# Patient Record
Sex: Female | Born: 1980
Health system: Southern US, Community
[De-identification: ages and names within clinical notes are randomized; demographics above are authoritative.]

## PROBLEM LIST (undated history)

## (undated) DIAGNOSIS — M5430 Sciatica, unspecified side: Secondary | ICD-10-CM

## (undated) DIAGNOSIS — H1859 Other hereditary corneal dystrophies: Secondary | ICD-10-CM

## (undated) DIAGNOSIS — E119 Type 2 diabetes mellitus without complications: Secondary | ICD-10-CM

## (undated) DIAGNOSIS — M1990 Unspecified osteoarthritis, unspecified site: Secondary | ICD-10-CM

## (undated) DIAGNOSIS — H18599 Other hereditary corneal dystrophies, unspecified eye: Secondary | ICD-10-CM

## (undated) DIAGNOSIS — H539 Unspecified visual disturbance: Secondary | ICD-10-CM

## (undated) HISTORY — DX: Other hereditary corneal dystrophies, unspecified eye: H18.599

## (undated) HISTORY — DX: Type 2 diabetes mellitus without complications: E11.9

## (undated) HISTORY — DX: Other hereditary corneal dystrophies: H18.59

## (undated) HISTORY — DX: Unspecified visual disturbance: H53.9

## (undated) HISTORY — DX: Unspecified osteoarthritis, unspecified site: M19.90

## (undated) HISTORY — PX: CHOLECYSTECTOMY: SHX55

## (undated) HISTORY — PX: EYE SURGERY: SHX253

---

## 2009-01-13 ENCOUNTER — Inpatient Hospital Stay (HOSPITAL_COMMUNITY): Admission: AD | Admit: 2009-01-13 | Discharge: 2009-01-13 | Payer: Self-pay | Admitting: Obstetrics & Gynecology

## 2009-01-13 ENCOUNTER — Ambulatory Visit: Payer: Self-pay | Admitting: Advanced Practice Midwife

## 2009-02-26 ENCOUNTER — Encounter: Admission: RE | Admit: 2009-02-26 | Discharge: 2009-02-26 | Payer: Self-pay | Admitting: Obstetrics and Gynecology

## 2009-04-16 ENCOUNTER — Ambulatory Visit: Payer: Self-pay | Admitting: Obstetrics & Gynecology

## 2009-04-19 ENCOUNTER — Ambulatory Visit: Payer: Self-pay | Admitting: Obstetrics & Gynecology

## 2009-05-07 ENCOUNTER — Inpatient Hospital Stay (HOSPITAL_COMMUNITY): Admission: RE | Admit: 2009-05-07 | Discharge: 2009-05-09 | Payer: Self-pay | Admitting: Obstetrics & Gynecology

## 2009-08-16 ENCOUNTER — Emergency Department (HOSPITAL_COMMUNITY): Admission: EM | Admit: 2009-08-16 | Discharge: 2009-08-16 | Payer: Self-pay | Admitting: Emergency Medicine

## 2009-10-21 ENCOUNTER — Emergency Department (HOSPITAL_COMMUNITY): Admission: EM | Admit: 2009-10-21 | Discharge: 2009-10-22 | Payer: Self-pay | Admitting: Emergency Medicine

## 2010-05-22 ENCOUNTER — Emergency Department (HOSPITAL_COMMUNITY): Admission: EM | Admit: 2010-05-22 | Discharge: 2010-05-22 | Payer: Self-pay | Admitting: Emergency Medicine

## 2010-10-08 LAB — DIFFERENTIAL
Basophils Absolute: 0 10*3/uL (ref 0.0–0.1)
Basophils Relative: 0 % (ref 0–1)
Eosinophils Absolute: 0.2 10*3/uL (ref 0.0–0.7)
Monocytes Absolute: 0.4 10*3/uL (ref 0.1–1.0)
Monocytes Relative: 8 % (ref 3–12)
Neutro Abs: 2.2 10*3/uL (ref 1.7–7.7)
Neutrophils Relative %: 49 % (ref 43–77)

## 2010-10-08 LAB — BASIC METABOLIC PANEL
BUN: 9 mg/dL (ref 6–23)
CO2: 28 mEq/L (ref 19–32)
Calcium: 9.7 mg/dL (ref 8.4–10.5)
GFR calc non Af Amer: >60 mL/min (ref >60)
Glucose, Bld: 110 mg/dL — ABNORMAL HIGH (ref 70–99)

## 2010-10-08 LAB — URINALYSIS, ROUTINE W REFLEX MICROSCOPIC
Glucose, UA: NEGATIVE mg/dL
Nitrite: NEGATIVE
Specific Gravity, Urine: 1.016 (ref 1.005–1.030)
pH: 6 (ref 5.0–8.0)

## 2010-10-08 LAB — CBC
HCT: 41.3 % (ref 36.0–46.0)
MCH: 31.4 pg (ref 26.0–34.0)
MCHC: 33.2 g/dL (ref 30.0–36.0)
RDW: 13 % (ref 11.5–15.5)

## 2010-10-08 LAB — URINE MICROSCOPIC-ADD ON

## 2010-10-12 LAB — URINALYSIS, ROUTINE W REFLEX MICROSCOPIC
Bilirubin Urine: NEGATIVE
Nitrite: NEGATIVE
Protein, ur: NEGATIVE mg/dL
Specific Gravity, Urine: 1.025 (ref 1.005–1.030)
Urobilinogen, UA: 1 mg/dL (ref 0.0–1.0)

## 2010-10-12 LAB — URINE MICROSCOPIC-ADD ON

## 2010-10-30 LAB — DIFFERENTIAL
Basophils Absolute: 0.1 10*3/uL (ref 0.0–0.1)
Basophils Relative: 1 % (ref 0–1)
Eosinophils Relative: 2 % (ref 0–5)
Lymphocytes Relative: 21 % (ref 12–46)
Monocytes Absolute: 0.4 10*3/uL (ref 0.1–1.0)
Neutro Abs: 5 10*3/uL (ref 1.7–7.7)

## 2010-10-30 LAB — CBC
HCT: 26.7 % — ABNORMAL LOW (ref 36.0–46.0)
HCT: 38.6 % (ref 36.0–46.0)
HCT: 39.9 % (ref 36.0–46.0)
Hemoglobin: 13.1 g/dL (ref 12.0–15.0)
Hemoglobin: 13.3 g/dL (ref 12.0–15.0)
Hemoglobin: 9.1 g/dL — ABNORMAL LOW (ref 12.0–15.0)
Hemoglobin: 9.2 g/dL — ABNORMAL LOW (ref 12.0–15.0)
MCHC: 33.3 g/dL (ref 30.0–36.0)
MCHC: 33.8 g/dL (ref 30.0–36.0)
MCV: 95.8 fL (ref 78.0–100.0)
MCV: 96.2 fL (ref 78.0–100.0)
Platelets: 64 10*3/uL — ABNORMAL LOW (ref 150–400)
Platelets: 97 10*3/uL — ABNORMAL LOW (ref 150–400)
RBC: 4.07 MIL/uL (ref 3.87–5.11)
RBC: 4.15 MIL/uL (ref 3.87–5.11)
RDW: 14.5 % (ref 11.5–15.5)
RDW: 14.5 % (ref 11.5–15.5)
RDW: 14.6 % (ref 11.5–15.5)
WBC: 6.4 10*3/uL (ref 4.0–10.5)
WBC: 6.7 10*3/uL (ref 4.0–10.5)

## 2010-10-30 LAB — GLUCOSE, CAPILLARY
Glucose-Capillary: 112 mg/dL — ABNORMAL HIGH (ref 70–99)
Glucose-Capillary: 153 mg/dL — ABNORMAL HIGH (ref 70–99)

## 2012-05-17 ENCOUNTER — Emergency Department (HOSPITAL_COMMUNITY)
Admission: EM | Admit: 2012-05-17 | Discharge: 2012-05-17 | Disposition: A | Discharge status: Home / Self Care | Payer: BC Managed Care – PPO | Attending: Emergency Medicine | Admitting: Emergency Medicine

## 2012-05-17 ENCOUNTER — Encounter (HOSPITAL_COMMUNITY): Payer: Self-pay | Admitting: *Deleted

## 2012-05-17 DIAGNOSIS — M5430 Sciatica, unspecified side: Secondary | ICD-10-CM | POA: Insufficient documentation

## 2012-05-17 HISTORY — DX: Sciatica, unspecified side: M54.30

## 2012-05-17 MED ORDER — OXYCODONE-ACETAMINOPHEN 5-325 MG PO TABS: 2.0000 | ORAL_TABLET | ORAL | Status: DC | PRN

## 2012-05-17 MED ORDER — DIAZEPAM 5 MG PO TABS: 5.0000 mg | ORAL_TABLET | Freq: Four times a day (QID) | ORAL | Status: DC | PRN

## 2012-05-17 MED ORDER — IBUPROFEN 600 MG PO TABS: 600.0000 mg | ORAL_TABLET | Freq: Four times a day (QID) | ORAL | Status: DC | PRN

## 2012-05-17 NOTE — ED Provider Notes (Signed)
History  Scribed for [employer]. Rubin Payor, MD, the patient was seen in room TR06C/TR06C. This chart was scribed by Candelaria Stagers. The patient's care started at 6:52 PM   CSN: [id number]  Arrival date & time 05/17/12  1703   First MD Initiated Contact with Patient 05/17/12 1850      Chief Complaint  Patient presents with  . back spasms   . Abdominal Injury    The history is provided by the patient. No language interpreter was used.   Jamie Lee is a 31 y.o. female who presents to the Emergency Department complaining of an exacerbation of muscle spasms and sciatica pain bilaterally that have become worse today.  She denies any recent injury or activity.  Pt states that she has not had an exacerbation in several months.  She reports that the pain radiates down to her knee and bearing weight on both legs makes the pain worse.  She denies any numbness, weakness, or loss of bowel or bladder control.     Past Medical History  Diagnosis Date  . Sciatica     Past Surgical History  Procedure Date  . Cholecystectomy     No family history on file.  History  Substance Use Topics  . Smoking status: Never Smoker   . Smokeless tobacco: Not on file  . Alcohol Use: Yes     occ    OB History    Grav Para Term Preterm Abortions TAB SAB Ect Mult Living                  Review of Systems  Constitutional: Negative for fever.  Genitourinary: Negative for difficulty urinating.  Musculoskeletal: Positive for back pain.       Muscle spasms to lower back bilaterally.   Neurological: Negative for weakness and numbness.  All other systems reviewed and are negative.    Allergies  Review of patient's allergies indicates no known allergies.  Home Medications   Current Outpatient Rx  Name Route Sig Dispense Refill  . IBUPROFEN 200 MG PO TABS Oral Take 800 mg by mouth every 6 (six) hours as needed. For pain.    Marland Kitchen DIAZEPAM 5 MG PO TABS Oral Take 1 tablet (5 mg total) by mouth every 6  (six) hours as needed (spasm). 10 tablet 0  . IBUPROFEN 600 MG PO TABS Oral Take 1 tablet (600 mg total) by mouth every 6 (six) hours as needed for pain. 20 tablet 0  . OXYCODONE-ACETAMINOPHEN 5-325 MG PO TABS Oral Take 2 tablets by mouth every 4 (four) hours as needed for pain. 15 tablet 0    BP 113/78  Pulse 81  Temp 98.4 F (36.9 C) (Oral)  Resp 18  SpO2 100%  Physical Exam  Nursing note and vitals reviewed. Constitutional: She is oriented to person, place, and time. She appears well-developed and well-nourished. No distress.  HENT:  Head: Normocephalic and atraumatic.  Eyes: EOM are normal. Pupils are equal, round, and reactive to light.  Neck: Neck supple. No tracheal deviation present.  Pulmonary/Chest: Effort normal. No respiratory distress.  Abdominal: There is no tenderness.  Musculoskeletal: Normal range of motion. She exhibits no edema.       Mild SI joint area tenderness bilaterally.  Strength and sensation intact distally.    Neurological: She is alert and oriented to person, place, and time. No sensory deficit.  Skin: Skin is warm and dry.  Psychiatric: She has a normal mood and affect. Her behavior is normal.  ED Course  Procedures   DIAGNOSTIC STUDIES:  COORDINATION OF CARE:    Labs Reviewed - No data to display No results found.   1. Sciatica       MDM  Patient with back pain. History of same. History of sciatica. No new injury. Patient is driving home. She's given prescriptions for pain medicines. She has no warning signs for severe surgical disease.  I personally performed the services described in this documentation, which was scribed in my presence. The recorded information has been reviewed and considered.         Juliet Rude. Rubin Payor, MD 05/17/12 2000

## 2012-05-17 NOTE — ED Notes (Signed)
PT is here with bilateral sciatica pain that goes down both sides and spasms have been bad today

## 2012-05-30 ENCOUNTER — Encounter (HOSPITAL_COMMUNITY): Payer: Self-pay

## 2012-05-30 ENCOUNTER — Emergency Department (HOSPITAL_COMMUNITY)
Admission: EM | Admit: 2012-05-30 | Discharge: 2012-05-30 | Disposition: A | Discharge status: Home / Self Care | Payer: BC Managed Care – PPO | Source: Home / Self Care | Attending: Emergency Medicine | Admitting: Emergency Medicine

## 2012-05-30 DIAGNOSIS — M549 Dorsalgia, unspecified: Secondary | ICD-10-CM

## 2012-05-30 DIAGNOSIS — G8929 Other chronic pain: Secondary | ICD-10-CM

## 2012-05-30 MED ORDER — KETOROLAC TROMETHAMINE 10 MG PO TABS: 10.0000 mg | ORAL_TABLET | Freq: Four times a day (QID) | ORAL | Status: DC | PRN

## 2012-05-30 NOTE — ED Provider Notes (Signed)
Degrees he is aAnd is andHistory     CSN: [id number]  Arrival date & time 05/30/12  1525   First MD Initiated Contact with Patient 05/30/12 1549      Chief Complaint  Patient presents with  . Back Pain    (Consider location/radiation/quality/duration/timing/severity/associated sxs/prior treatment) HPI Comments: Patient presents urgent care this evening complaining of ongoing lower back pain that radiates down to both of her legs. She was last seen in the emergency department in the last week of October. She describes the pain at times feels better she can go about a week without any pain. The last 3-4 days she's been expressing pain again describes it is very difficult for her to take the previously prescribed Percocet and diazepam but was prescribed to her primary spell medicine makes her feel drowsy asleep throughout the day. She wants to know if she can take anything else on the day so she will be as drowsy and sleepy. Patient denies any constitutional symptoms such as fevers, generalized malaise unintended weight loss myalgias arthralgias. Denies any numbness or tingling or weakness of her lower extremities. Describes the pain as shooting and sharp in character. Exacerbated with movement and leaning forward.  The history is provided by the patient.    Past Medical History  Diagnosis Date  . Sciatica     Past Surgical History  Procedure Date  . Cholecystectomy     No family history on file.  History  Substance Use Topics  . Smoking status: Never Smoker   . Smokeless tobacco: Not on file  . Alcohol Use: Yes     Comment: occ    OB History    Grav Para Term Preterm Abortions TAB SAB Ect Mult Living                  Review of Systems  Constitutional: Positive for activity change. Negative for fever, chills, diaphoresis, appetite change and fatigue.  Genitourinary: Negative for dysuria, flank pain and difficulty urinating.  Musculoskeletal: Positive for back pain.  Negative for joint swelling and gait problem.    Allergies  Review of patient's allergies indicates no known allergies.  Home Medications   Current Outpatient Rx  Name  Route  Sig  Dispense  Refill  . DIAZEPAM 5 MG PO TABS   Oral   Take 1 tablet (5 mg total) by mouth every 6 (six) hours as needed (spasm).   10 tablet   0   . OXYCODONE-ACETAMINOPHEN 5-325 MG PO TABS   Oral   Take 2 tablets by mouth every 4 (four) hours as needed for pain.   15 tablet   0   . KETOROLAC TROMETHAMINE 10 MG PO TABS   Oral   Take 1 tablet (10 mg total) by mouth every 6 (six) hours as needed for pain.   20 tablet   0     BP 120/89  Pulse 72  Temp 98.1 F (36.7 C) (Oral)  Resp 18  SpO2 100%  Physical Exam  Nursing note and vitals reviewed. Constitutional: Vital signs are normal. She appears well-developed and well-nourished.  Non-toxic appearance. She does not have a sickly appearance. She does not appear ill. No distress.  Eyes: Conjunctivae normal are normal.  Neck: Neck supple.  Pulmonary/Chest: Effort normal. No respiratory distress. She has no wheezes. She has no rales. She exhibits no tenderness.  Abdominal: Soft. She exhibits no distension.  Musculoskeletal: She exhibits tenderness.       Lumbar back: She  exhibits decreased range of motion, tenderness and pain. She exhibits no bony tenderness, no swelling, no edema, no deformity, no spasm and normal pulse.       Back:  Neurological: She is alert.  Skin: Skin is warm. No rash noted. No erythema.    ED Course  Procedures (including critical care time)  Labs Reviewed - No data to display No results found.   1. Chronic back pain       MDM   Exacerbation of chronic back pain. On exam patient demonstrated conserve and normal distal muscular strength of both of her quadriceps. Patient is able to dorsiflex and plantarflex her great toe. No distal vascular deficits. Patient in has been strongly recommended to followup in  orthopedic provider we had a long discussion about the long-term treatment of his pain with narcotics and benzodiazepines. Patient agrees that she will followup with [place] [facility] and we have prescribed her a prescription of Toradol to be use every 8-12 hours as needed. Patient agrees with treatment plan and understands that there might be secondary reasons for ongoing back pain have recommended her that she needs to have a full workup for her back. At this point her exam did not reveal any neurological emergencies such as a kaluretic canis syndrome or suggestion of spinal stenosis.       Jimmie Molly, MD 05/30/12 [phone]

## 2012-05-30 NOTE — ED Notes (Signed)
Patient complains of lower back pain that radiates down both legs, RX given in ER on 10/22 does not help with pain states it does not work and her knees are hurting too

## 2016-04-17 ENCOUNTER — Encounter (HOSPITAL_COMMUNITY): Payer: Self-pay | Admitting: *Deleted

## 2016-04-17 ENCOUNTER — Emergency Department (HOSPITAL_COMMUNITY)
Admission: EM | Admit: 2016-04-17 | Discharge: 2016-04-17 | Disposition: A | Discharge status: Home / Self Care | Payer: Self-pay | Attending: Emergency Medicine | Admitting: Emergency Medicine

## 2016-04-17 DIAGNOSIS — K219 Gastro-esophageal reflux disease without esophagitis: Secondary | ICD-10-CM | POA: Insufficient documentation

## 2016-04-17 DIAGNOSIS — R197 Diarrhea, unspecified: Secondary | ICD-10-CM | POA: Insufficient documentation

## 2016-04-17 LAB — CBC
HEMATOCRIT: 41.8 % (ref 36.0–46.0)
Hemoglobin: 13.4 g/dL (ref 12.0–15.0)
MCH: 30.5 pg (ref 26.0–34.0)
MCHC: 32.1 g/dL (ref 30.0–36.0)
MCV: 95 fL (ref 78.0–100.0)
Platelets: 205 10*3/uL (ref 150–400)
RBC: 4.4 MIL/uL (ref 3.87–5.11)
RDW: 13.5 % (ref 11.5–15.5)
WBC: 6.3 10*3/uL (ref 4.0–10.5)

## 2016-04-17 LAB — COMPREHENSIVE METABOLIC PANEL
ALBUMIN: 3.8 g/dL (ref 3.5–5.0)
ALT: 21 U/L (ref 14–54)
AST: 21 U/L (ref 15–41)
Alkaline Phosphatase: 57 U/L (ref 38–126)
Anion gap: 7 (ref 5–15)
BUN: 12 mg/dL (ref 6–20)
CHLORIDE: 105 mmol/L (ref 101–111)
CO2: 25 mmol/L (ref 22–32)
Calcium: 9.5 mg/dL (ref 8.9–10.3)
Creatinine, Ser: 0.74 mg/dL (ref 0.44–1.00)
GFR calc Af Amer: >60 mL/min (ref >60)
GFR calc non Af Amer: >60 mL/min (ref >60)
GLUCOSE: 105 mg/dL — AB (ref 65–99)
POTASSIUM: 3.6 mmol/L (ref 3.5–5.1)
Sodium: 137 mmol/L (ref 135–145)
Total Bilirubin: 0.8 mg/dL (ref 0.3–1.2)
Total Protein: 7.2 g/dL (ref 6.5–8.1)

## 2016-04-17 LAB — URINALYSIS, ROUTINE W REFLEX MICROSCOPIC
Bilirubin Urine: NEGATIVE
GLUCOSE, UA: NEGATIVE mg/dL
Hgb urine dipstick: NEGATIVE
KETONES UR: NEGATIVE mg/dL
LEUKOCYTES UA: NEGATIVE
Nitrite: NEGATIVE
PH: 6 (ref 5.0–8.0)
Protein, ur: NEGATIVE mg/dL
Specific Gravity, Urine: 1.025 (ref 1.005–1.030)

## 2016-04-17 LAB — LIPASE, BLOOD: LIPASE: 28 U/L (ref 11–51)

## 2016-04-17 MED ORDER — OMEPRAZOLE 20 MG PO CPDR: 20.0000 mg | DELAYED_RELEASE_CAPSULE | Freq: Every day | ORAL | 0 refills | Status: DC

## 2016-04-17 NOTE — ED Triage Notes (Signed)
abd pain for one week  She has had morfe mucous in her bms than usual  No diarrhea  Pain with bmx  lmp  Last month  Period light

## 2016-04-17 NOTE — ED Provider Notes (Signed)
[facility]-EMERGENCY DEPT Provider Note   CSN: [id number] Arrival date & time: 04/17/16  1916     History   Chief Complaint Chief Complaint  Patient presents with  . Abdominal Pain    HPI Jamie Lee is a 35 y.o. female.   Abdominal Pain   This is a new problem. Episode onset: 1 week. The problem occurs daily. The problem has not changed since onset.The pain is associated with eating. The pain is located in the LUQ and LLQ. The quality of the pain is cramping. The pain is mild. Associated symptoms include anorexia. Pertinent negatives include fever, nausea, vomiting, constipation, dysuria, hematuria, headaches and arthralgias. Associated symptoms comments: Patient has mucous lined bowel movements each time she eats.. The symptoms are aggravated by eating. Nothing relieves the symptoms. Her past medical history is significant for GERD. Her past medical history does not include ulcerative colitis, Crohn's disease or irritable bowel syndrome.    Past Medical History:  Diagnosis Date  . Sciatica     There are no active problems to display for this patient.   Past Surgical History:  Procedure Laterality Date  . CHOLECYSTECTOMY      OB History    No data available       Home Medications    Prior to Admission medications   Medication Sig Start Date End Date Taking? Authorizing Provider  acetaminophen (TYLENOL) 500 MG tablet Take 1,000 mg by mouth every 6 (six) hours as needed.   Yes Historical Provider, MD  ibuprofen (ADVIL,MOTRIN) 200 MG tablet Take 400 mg by mouth every 6 (six) hours as needed.   Yes Historical Provider, MD  diazepam (VALIUM) 5 MG tablet Take 1 tablet (5 mg total) by mouth every 6 (six) hours as needed (spasm). Patient not taking: Reported on 04/17/2016 05/17/12   Benjiman CoreNathan Pickering, MD  ketorolac (TORADOL) 10 MG tablet Take 1 tablet (10 mg total) by mouth every 6 (six) hours as needed for pain. Patient not taking: Reported on 04/17/2016 05/30/12   Jimmie MollyPaolo Coll,  MD  oxyCODONE-acetaminophen (PERCOCET/ROXICET) 5-325 MG per tablet Take 2 tablets by mouth every 4 (four) hours as needed for pain. Patient not taking: Reported on 04/17/2016 05/17/12   Benjiman CoreNathan Pickering, MD    Family History No family history on file.  Social History Social History  Substance Use Topics  . Smoking status: Never Smoker  . Smokeless tobacco: Never Used  . Alcohol use Yes     Comment: occ     Allergies   Review of patient's allergies indicates no known allergies.   Review of Systems Review of Systems  Constitutional: Negative for chills and fever.  HENT: Negative for ear pain and sore throat.   Eyes: Negative for pain and visual disturbance.  Respiratory: Negative for cough and shortness of breath.   Cardiovascular: Negative for chest pain and palpitations.  Gastrointestinal: Positive for abdominal pain and anorexia. Negative for anal bleeding, constipation, nausea and vomiting.  Genitourinary: Negative for dysuria and hematuria.  Musculoskeletal: Negative for arthralgias and back pain.  Skin: Negative for color change and rash.  Neurological: Negative for seizures, syncope and headaches.  All other systems reviewed and are negative.    Physical Exam Updated Vital Signs BP 105/79   Pulse 75   Temp 98.3 F (36.8 C)   Resp 18   SpO2 98%   Physical Exam  Constitutional: She appears well-developed and well-nourished. No distress.  HENT:  Head: Normocephalic and atraumatic.  Eyes: Conjunctivae are normal.  Neck:  Neck supple.  Cardiovascular: Normal rate and regular rhythm.   No murmur heard. Pulmonary/Chest: Effort normal and breath sounds normal. No respiratory distress.  Abdominal: Soft. There is no tenderness.  Musculoskeletal: She exhibits no edema.  Neurological: She is alert.  Skin: Skin is warm and dry.  Psychiatric: She has a normal mood and affect.  Nursing note and vitals reviewed.    ED Treatments / Results  Labs (all labs ordered  are listed, but only abnormal results are displayed) Labs Reviewed  COMPREHENSIVE METABOLIC PANEL - Abnormal; Notable for the following:       Result Value   Glucose, Bld 105 (*)    All other components within normal limits  LIPASE, BLOOD  CBC  URINALYSIS, ROUTINE W REFLEX MICROSCOPIC (NOT AT [facility])  POC URINE PREG, ED    EKG  EKG Interpretation None       Radiology No results found.  Procedures Procedures (including critical care time)  Medications Ordered in ED Medications - No data to display   Initial Impression / Assessment and Plan / ED Course  I have reviewed the triage vital signs and the nursing notes.  Pertinent labs & imaging results that were available during my care of the patient were reviewed by me and considered in my medical decision making (see chart for details).  Clinical Course    Patient presents for increased stooling frequency with mucous in her stools.  She has no blood in the stools and she states they are soft but well formed.  She has no recent travel or antibiotic exposure.  No suspicious food intake.  Patient does also endorse acid indigestion symptoms. She states she was taking nexium with good response initially but then is stopped working, so she discontinued take it.  On further questioning, she may not have been taking the nexium consistently prior to discontinuing.  I encouraged her to restart Nexium or to try Prilosec and explained the importance of consistent use.  Abdominal exam is benign and labs are unremarkable. Doubt surgical cause of abdominal pain, ectopic pregnancy.  Patient was not able to provide a stool sample at this time.  Most likely, the patient is felt to be suffering from enteritis.  Patient was offered a CT exam, but declined.  Patient is discharged to home with strict return precautions, instructions to try imodium for symptom control for up to 48 hours, follow up instructions, and educational materials.  Final  Clinical Impressions(s) / ED Diagnoses   Final diagnoses:  Diarrhea, unspecified type  Gastroesophageal reflux disease, esophagitis presence not specified    New Prescriptions Discharge Medication List as of 04/17/2016 11:17 PM    START taking these medications   Details  omeprazole (PRILOSEC) 20 MG capsule Take 1 capsule (20 mg total) by mouth daily., Starting Fri 04/17/2016, Until Fri 05/01/2016, Print         Garey Ham, MD 04/18/16 1124    Linwood Dibbles, MD 04/19/16 2027

## 2016-04-17 NOTE — ED Notes (Signed)
Pt walked to bathroom to try to give stool sample. However pt states it is possible that she is not able to give that sample since she doesn't feel like she has to use bathroom now.

## 2016-04-20 LAB — POC URINE PREG, ED: PREG TEST UR: NEGATIVE

## 2018-06-16 ENCOUNTER — Other Ambulatory Visit: Payer: Self-pay

## 2018-06-16 ENCOUNTER — Encounter (HOSPITAL_COMMUNITY): Payer: Self-pay

## 2018-06-16 ENCOUNTER — Emergency Department (HOSPITAL_COMMUNITY)
Admission: EM | Admit: 2018-06-16 | Discharge: 2018-06-16 | Disposition: A | Discharge status: Home / Self Care | Payer: Self-pay | Attending: Emergency Medicine | Admitting: Emergency Medicine

## 2018-06-16 DIAGNOSIS — M2550 Pain in unspecified joint: Secondary | ICD-10-CM | POA: Insufficient documentation

## 2018-06-16 MED ORDER — DICLOFENAC SODIUM 1 % TD GEL: 2.0000 g | Freq: Four times a day (QID) | TRANSDERMAL | 1 refills | Status: DC

## 2018-06-16 MED ORDER — PREDNISONE 20 MG PO TABS: 40.0000 mg | ORAL_TABLET | Freq: Every day | ORAL | 0 refills | Status: DC

## 2018-06-16 NOTE — ED Triage Notes (Signed)
Pt endorses generalized joint pain x1 year. Pt endorses pain in back, shoulders, wrists, and ankles. Pt denies any recent injuries. Pt states saw a PCP 1 year ago but has not had follow ups. Pt is ambulatory.

## 2018-06-16 NOTE — ED Provider Notes (Signed)
[facility] EMERGENCY DEPARTMENT Provider Note   CSN: [id number] Arrival date & time: 06/16/18  1758     History   Chief Complaint Chief Complaint  Patient presents with  . Joint Pain    HPI Jamie Lee is a 37 y.o. female.  Patient is a pleasant 37 year old female with a history of chronic pain who is presenting today because it is not getting better and she just could not take it anymore.  Patient states for years now she has had back pain, hip pain and knee pain but in the last year it seems to have gotten much worse.  It involves every joint.  She gets it in her shoulders, her spine, her wrists, fingers.  Approximately 1 year ago she followed up with PCP and mentioned this and was going to get further work-up but that she then lost her insurance.  She has never visibly seen any redness or swelling of her joints.  She states she is in pain every day.  She denies any fever.  She does have regular headaches she is recently had weight gain because she is unable to exercise related to the pain and she does have irregular bowel movements.  She will have loose stool then constipation and then inability to have a bowel movement for several days.  She states that has been a long-standing problem.  She has never undergone further testing for her symptoms.  Uses Tylenol and ibuprofen as needed for the pain.  The history is provided by the patient.    Past Medical History:  Diagnosis Date  . Sciatica     There are no active problems to display for this patient.   Past Surgical History:  Procedure Laterality Date  . CHOLECYSTECTOMY       OB History   None      Home Medications    Prior to Admission medications   Medication Sig Start Date End Date Taking? Authorizing Provider  acetaminophen (TYLENOL) 500 MG tablet Take 1,000 mg by mouth every 6 (six) hours as needed.    [provider]  diazepam (VALIUM) 5 MG tablet Take 1 tablet (5 mg total) by  mouth every 6 (six) hours as needed (spasm). Patient not taking: Reported on 04/17/2016 05/17/12   Benjiman Core, MD  ibuprofen (ADVIL,MOTRIN) 200 MG tablet Take 400 mg by mouth every 6 (six) hours as needed.    [provider]  ketorolac (TORADOL) 10 MG tablet Take 1 tablet (10 mg total) by mouth every 6 (six) hours as needed for pain. Patient not taking: Reported on 04/17/2016 05/30/12   Jimmie Molly, MD  omeprazole (PRILOSEC) 20 MG capsule Take 1 capsule (20 mg total) by mouth daily. 04/17/16 05/01/16  Garey Ham, MD  oxyCODONE-acetaminophen (PERCOCET/ROXICET) 5-325 MG per tablet Take 2 tablets by mouth every 4 (four) hours as needed for pain. Patient not taking: Reported on 04/17/2016 05/17/12   Benjiman Core, MD    Family History No family history on file.  Social History Social History   Tobacco Use  . Smoking status: Never Smoker  . Smokeless tobacco: Never Used  Substance Use Topics  . Alcohol use: Yes    Comment: occ  . Drug use: No     Allergies   Patient has no known allergies.   Review of Systems Review of Systems  All other systems reviewed and are negative.    Physical Exam Updated Vital Signs BP (!) 129/97 (BP Location: Right Arm)  Pulse (!) 104   Temp 98.2 F (36.8 C) (Oral)   Resp 18   Ht 5\' 6"  (1.676 m)   LMP  (Within Weeks)   SpO2 100%   Physical Exam  Constitutional: She is oriented to person, place, and time. She appears well-developed and well-nourished. No distress.  HENT:  Head: Normocephalic and atraumatic.  Eyes: Pupils are equal, round, and reactive to light. EOM are normal.  Cardiovascular: Normal rate, regular rhythm, normal heart sounds and intact distal pulses. Exam reveals no friction rub.  No murmur heard. Pulmonary/Chest: Effort normal and breath sounds normal. She has no wheezes. She has no rales.  Abdominal: Soft. Bowel sounds are normal. She exhibits no distension. There is no tenderness. There is no rebound  and no guarding.  Musculoskeletal: Normal range of motion. She exhibits no tenderness.  Joints visibly appear normal.  There is tenderness with palpation of joints in bilateral fingers, knees.  Clicking and popping noted with flexion and extension of bilateral knees.  Tenderness with palpation over the lower back.  Neurological: She is alert and oriented to person, place, and time. No cranial nerve deficit.  Skin: Skin is warm and dry. No rash noted.  Psychiatric: She has a normal mood and affect. Her behavior is normal.  Nursing note and vitals reviewed.    ED Treatments / Results  Labs (all labs ordered are listed, but only abnormal results are displayed) Labs Reviewed - No data to display  EKG None  Radiology No results found.  Procedures Procedures (including critical care time)  Medications Ordered in ED Medications - No data to display   Initial Impression / Assessment and Plan / ED Course  I have reviewed the triage vital signs and the nursing notes.  Pertinent labs & imaging results that were available during my care of the patient were reviewed by me and considered in my medical decision making (see chart for details).     Patient presenting today with years of worsening joint pain.  She has never been evaluated for this.  There is no visual signs of abnormality on exam here but tenderness with palpation of joints in the finger, shoulder, back, knees.  Discussed with patient the possibility of an immunologic disease such as rheumatoid arthritis or lupus.  Also discussed the possibility of food sensitivities and trying dietary changes that cut out gluten and other inflammatory foods to see if she has any improvement.  Patient was given follow-up information for regular doctor so that she can have further testing and evaluation.  Discussed with her the option of checking labs here versus following up with her PCP.  She wishes to follow-up with PCP so it is more affordable  for her.  She is otherwise stable here.  Patient was given Voltaren gel and a 5-day course of prednisone to see if that improves her symptoms.  Final Clinical Impressions(s) / ED Diagnoses   Final diagnoses:  Arthralgia, unspecified joint    ED Discharge Orders         Ordered    predniSONE (DELTASONE) 20 MG tablet  Daily     06/16/18 1906    diclofenac sodium (VOLTAREN) 1 % GEL  4 times daily     06/16/18 1906           Gwyneth SproutPlunkett, Josphine Laffey, MD 06/16/18 1906

## 2018-06-22 ENCOUNTER — Encounter: Payer: Self-pay | Admitting: Family Medicine

## 2018-06-27 ENCOUNTER — Ambulatory Visit (INDEPENDENT_AMBULATORY_CARE_PROVIDER_SITE_OTHER): Payer: Self-pay | Admitting: Family Medicine

## 2018-06-27 ENCOUNTER — Encounter: Payer: Self-pay | Admitting: Family Medicine

## 2018-06-27 VITALS — BP 105/73 | HR 75 | Resp 17 | Ht 70.0 in | Wt 271.6 lb

## 2018-06-27 DIAGNOSIS — G8929 Other chronic pain: Secondary | ICD-10-CM

## 2018-06-27 DIAGNOSIS — M51379 Other intervertebral disc degeneration, lumbosacral region without mention of lumbar back pain or lower extremity pain: Secondary | ICD-10-CM

## 2018-06-27 DIAGNOSIS — M5442 Lumbago with sciatica, left side: Secondary | ICD-10-CM

## 2018-06-27 DIAGNOSIS — M5137 Other intervertebral disc degeneration, lumbosacral region: Secondary | ICD-10-CM

## 2018-06-27 DIAGNOSIS — M5441 Lumbago with sciatica, right side: Secondary | ICD-10-CM

## 2018-06-27 DIAGNOSIS — Z7689 Persons encountering health services in other specified circumstances: Secondary | ICD-10-CM

## 2018-06-27 DIAGNOSIS — M0580 Other rheumatoid arthritis with rheumatoid factor of unspecified site: Secondary | ICD-10-CM

## 2018-06-27 DIAGNOSIS — Z6839 Body mass index (BMI) 39.0-39.9, adult: Secondary | ICD-10-CM

## 2018-06-27 DIAGNOSIS — M2550 Pain in unspecified joint: Secondary | ICD-10-CM

## 2018-06-27 DIAGNOSIS — M25561 Pain in right knee: Secondary | ICD-10-CM

## 2018-06-27 MED ORDER — MELOXICAM 15 MG PO TABS: 15.0000 mg | ORAL_TABLET | Freq: Every day | ORAL | 0 refills | Status: DC

## 2018-06-27 NOTE — Patient Instructions (Addendum)
[facility]!    Jamie SellarShakiera Lee was seen by Joaquin CourtsKimberly Harris, FNP today.   Linus OrnShakiera Polivka's primary care provider is Bing NeighborsHarris, Kimberly S, FNP.   For the best care possible, you should try to see Joaquin CourtsKimberly Harris, FNP-C whenever you come to the clinic.   We look forward to seeing you again soon!  If you have any questions about your visit today, please call us at [phone] or feel free to reach your primary care provider via MyChart.      Chronic Pain, Adult Chronic pain is a type of pain that lasts or keeps coming back (recurs) for at least six months. You may have chronic headaches, abdominal pain, or body pain. Chronic pain may be related to an illness, such as fibromyalgia or complex regional pain syndrome. Sometimes the cause of chronic pain is not known. Chronic pain can make it hard for you to do daily activities. If not treated, chronic pain can lead to other health problems, including anxiety and depression. Treatment depends on the cause and severity of your pain. You may need to work with a pain specialist to come up with a treatment plan. The plan may include medicine, counseling, and physical therapy. Many people benefit from a combination of two or more types of treatment to control their pain. Follow these instructions at home: Lifestyle  Consider keeping a pain diary to share with your health care providers.  Consider talking with a mental health care provider (psychologist) about how to cope with chronic pain.  Consider joining a chronic pain support group.  Try to control or lower your stress levels. Talk to your health care provider about strategies to do this. General instructions   Take over-the-counter and prescription medicines only as told by your health care provider.  Follow your treatment plan as told by your health care provider. This may include: ? Gentle, regular exercise. ? Eating a  healthy diet that includes foods such as vegetables, fruits, fish, and lean meats. ? Cognitive or behavioral therapy. ? Working with a Adult nursephysical therapist. ? Meditation or yoga. ? Acupuncture or massage therapy. ? Aroma, color, light, or sound therapy. ? Local electrical stimulation. ? Shots (injections) of numbing or pain-relieving medicines into the spine or the area of pain.  Check your pain level as told by your health care provider. Ask your health care provider if you should use a pain scale.  Learn as much as you can about how to manage your chronic pain. Ask your health care provider if an intensive pain rehabilitation program or a chronic pain specialist would be helpful.  Keep all follow-up visits as told by your health care provider. This is important. Contact a health care provider if:  Your pain gets worse.  You have new pain.  You have trouble sleeping.  You have trouble doing your normal activities.  Your pain is not controlled with treatment.  Your have side effects from pain medicine.  You feel weak. Get help right away if:  You lose feeling or have numbness in your body.  You lose control of bowel or bladder function.  Your pain suddenly gets much worse.  You develop shaking or chills.  You develop confusion.  You develop chest pain.  You have trouble breathing or shortness of breath.  You pass out.  You have thoughts about hurting yourself or others. This information is not intended to replace advice given to you  by your health care provider. Make sure you discuss any questions you have with your health care provider. Document Released: 04/04/2002 Document Revised: 03/12/2016 Document Reviewed: 12/31/2015 Elsevier Interactive Patient Education  [employer].

## 2018-06-27 NOTE — Progress Notes (Signed)
New Patient Office Visit  Subjective:  Patient ID: Jamie Lee, female    DOB: 10-30-1980  Age: 37 y.o. MRN: [id number]  CC:  Chief Complaint  Patient presents with  . Establish Care  . Follow-up    ED 11/21: generalized joint pain. chronic pain. states that the Voltaren gel helped some but then the pain returned    HPI Jamie Lee presents to establish care and evaluation of chronic joint pain.Patient has been without PCP as she has lost her insurance approximately 1 year ago.  Patient reports ports are she can remember all the way back to her adolescence she is also points had some generalized joint pain.  No family history is insignificant autoimmune disorders or rheumatoid arthritis. She reports over the course of the last year the pain has had is has moved however recently the pain has gotten to be unbearable.  She presented to the ER on 06/16/2018 with a complaint of generalized joint pain.  Chronic findings that were significant on exam was that she had clicking and popping with flexion and extension of bilateral knees along with some bony tenderness over the lower back but otherwise exam within normal expected parameters.  She has no history of skin rashes or skin lesions.  She was provided Voltaren gel and prednisone. She was advised to follow-up establish with PCP. Voltaren gel temporarily improved symptoms, however pain returned once medication affects subsided.  No prior lab studies for symptoms. Denies chest pain, shortness of breath, nausea, vomiting, headaches, skin rashes. Endorses fatigue, poor mood related to pain, unintentional weight gain (unable to exercise due to pain), decreased activity due to pain. Current Body mass index is 38.97 kg/m. Past Medical History:  Diagnosis Date  . Arthritis   . Schnyder's crystalline corneal dystrophy   . Sciatica   . Vision abnormalities     Past Surgical History:  Procedure Laterality Date  . CHOLECYSTECTOMY    . EYE  SURGERY Bilateral     Family History  Problem Relation Age of Onset  . Diabetes Maternal Grandfather   . Blindness Maternal Grandfather     Social History   Socioeconomic History  . Marital status: Single    Spouse name: Not on file  . Number of children: Not on file  . Years of education: Not on file  . Highest education level: Not on file  Occupational History  . Not on file  Social Needs  . Financial resource strain: Not on file  . Food insecurity:    Worry: Not on file    Inability: Not on file  . Transportation needs:    Medical: Not on file    Non-medical: Not on file  Tobacco Use  . Smoking status: Never Smoker  . Smokeless tobacco: Never Used  Substance and Sexual Activity  . Alcohol use: Yes    Comment: occ  . Drug use: No  . Sexual activity: Not on file  Lifestyle  . Physical activity:    Days per week: Not on file    Minutes per session: Not on file  . Stress: Not on file  Relationships  . Social connections:    Talks on phone: Not on file    Gets together: Not on file    Attends religious service: Not on file    Active member of club or organization: Not on file    Attends meetings of clubs or organizations: Not on file    Relationship status: Not on file  .  Intimate partner violence:    Fear of current or ex partner: Not on file    Emotionally abused: Not on file    Physically abused: Not on file    Forced sexual activity: Not on file  Other Topics Concern  . Not on file  Social History Narrative  . Not on file    ROS Review of Systems  Pertinent negatives listed in HPI  Objective:   Today's Vitals: BP 105/73   Pulse 75   Resp 17   Ht 5\' 10"  (1.778 m)   Wt 271 lb 9.6 oz (123.2 kg)   SpO2 98%   BMI 38.97 kg/m   Physical Exam General appearance: alert, well developed, well nourished, cooperative and in no distress Head: Normocephalic, without obvious abnormality, atraumatic Respiratory: Respirations even and unlabored, normal  respiratory rate Extremities: No gross deformities noted on exam. Negative for joint nodules,, erythema or edema. Tenderness with palpation of joints. Full ROM of extremities  Skin: Skin color, texture, turgor normal. No rashes seen  Psych: Appropriate mood and flat affect. Appropriate judgement. Speech appropriate  Neurologic: Mental status: Alert, oriented to person, place, and time, thought content appropriate. Assessment & Plan:  1. Encounter to establish care 2. Generalized joint pain  Differentials include, RA, Lupus, or fibromyalgia.  Will obtain a rheumatoid and lupus panel work-up. If labs are significant for possible autoimmune disorder, will refer patient to rheumatology for further work-up and management   If negative, will discuss a trial of Cymbalta or Neurontin for management of pain.  - RA Qn+CCP(IgG/A)+SjoSSA+SjoSSB - Lupus (SLE) Analysis - C3 and C4 - Sedimentation Rate  3. Chronic bilateral low back pain with bilateral sciatica - DG Lumbar Spine Complete; Future  4. Chronic pain of right knee - DG Knee Complete 4 Views Right; Future   Will trial Meloxicam in addition to continue diclofenac gel.  Obtaining imaging of prominent area of MSK pain to rule out any degenerative changes. Recommended physical activity as tolerated in efforts to facilitate weight loss  Outpatient Encounter Medications as of 06/27/2018  Medication Sig  . diclofenac sodium (VOLTAREN) 1 % GEL Apply 2 g topically 4 (four) times daily.  . [DISCONTINUED] acetaminophen (TYLENOL) 500 MG tablet Take 1,000 mg by mouth every 6 (six) hours as needed.  . [DISCONTINUED] diazepam (VALIUM) 5 MG tablet Take 1 tablet (5 mg total) by mouth every 6 (six) hours as needed (spasm). (Patient not taking: Reported on 04/17/2016)  . [DISCONTINUED] ibuprofen (ADVIL,MOTRIN) 200 MG tablet Take 400 mg by mouth every 6 (six) hours as needed.  . [DISCONTINUED] ketorolac (TORADOL) 10 MG tablet Take 1 tablet (10 mg total)  by mouth every 6 (six) hours as needed for pain. (Patient not taking: Reported on 04/17/2016)  . [DISCONTINUED] omeprazole (PRILOSEC) 20 MG capsule Take 1 capsule (20 mg total) by mouth daily.  . [DISCONTINUED] oxyCODONE-acetaminophen (PERCOCET/ROXICET) 5-325 MG per tablet Take 2 tablets by mouth every 4 (four) hours as needed for pain. (Patient not taking: Reported on 04/17/2016)  . [DISCONTINUED] predniSONE (DELTASONE) 20 MG tablet Take 2 tablets (40 mg total) by mouth daily.   No facility-administered encounter medications on file as of 06/27/2018.     Follow-up: Return in about 3 months (around 09/26/2018) for sooner if labs warrant .    A total of 40  minutes spent, greater than 50 % of this time was spent reviewing symptoms, counseling regarding possible differentials,  and coordination of care.   Joaquin Courts, FNP [facility] [address], [place] [id number] [phone]: [phone]

## 2018-06-29 LAB — RA QN+CCP(IGG/A)+SJOSSA+SJOSSB
CYCLIC CITRULLIN PEPTIDE AB: 7 U (ref 0–19)
Rhuematoid fact SerPl-aCnc: 19.2 IU/mL — ABNORMAL HIGH (ref 0.0–13.9)

## 2018-06-29 LAB — LUPUS (SLE) ANALYSIS
Anti Nuclear Antibody(ANA): NEGATIVE
Anti-striation Abs: NEGATIVE
COMPLEMENT C4, SERUM: 41 mg/dL (ref 14–44)
ENA RNP Ab: 0.6 AI (ref 0.0–0.9)
ENA SSA (RO) Ab: <0.2 AI (ref 0.0–0.9)
PARIETAL CELL AB: 23.9 U — AB (ref 0.0–20.0)
SMOOTH MUSCLE AB: 12 U (ref 0–19)
Scleroderma SCL-70: <0.2 AI (ref 0.0–0.9)
Thyroperoxidase Ab SerPl-aCnc: 15 IU/mL (ref 0–34)
dsDNA Ab: 1 IU/mL (ref 0–9)

## 2018-06-29 LAB — C3 AND C4: COMPLEMENT C3, SERUM: 164 mg/dL (ref 82–167)

## 2018-06-29 LAB — SEDIMENTATION RATE: Sed Rate: 48 mm/hr — ABNORMAL HIGH (ref 0–32)

## 2018-07-05 ENCOUNTER — Ambulatory Visit (HOSPITAL_COMMUNITY)
Admission: RE | Admit: 2018-07-05 | Discharge: 2018-07-05 | Disposition: A | Discharge status: Home / Self Care | Payer: Self-pay | Source: Ambulatory Visit | Attending: Family Medicine | Admitting: Family Medicine

## 2018-07-05 DIAGNOSIS — M5442 Lumbago with sciatica, left side: Secondary | ICD-10-CM | POA: Insufficient documentation

## 2018-07-05 DIAGNOSIS — M2550 Pain in unspecified joint: Secondary | ICD-10-CM | POA: Insufficient documentation

## 2018-07-05 DIAGNOSIS — M25561 Pain in right knee: Secondary | ICD-10-CM | POA: Insufficient documentation

## 2018-07-05 DIAGNOSIS — G8929 Other chronic pain: Secondary | ICD-10-CM | POA: Insufficient documentation

## 2018-07-05 DIAGNOSIS — M5441 Lumbago with sciatica, right side: Secondary | ICD-10-CM | POA: Insufficient documentation

## 2018-07-06 ENCOUNTER — Encounter: Payer: Self-pay | Admitting: Family Medicine

## 2018-07-06 NOTE — Addendum Note (Signed)
Addended by: Bing NeighborsHARRIS, Jowel Waltner S on: 07/06/2018 08:24 AM   Modules accepted: Orders

## 2018-09-26 ENCOUNTER — Ambulatory Visit: Payer: Self-pay | Admitting: Family Medicine

## 2019-11-02 ENCOUNTER — Other Ambulatory Visit: Payer: Self-pay

## 2019-11-02 ENCOUNTER — Encounter (HOSPITAL_COMMUNITY): Payer: Self-pay

## 2019-11-02 ENCOUNTER — Ambulatory Visit (HOSPITAL_COMMUNITY)
Admission: EM | Admit: 2019-11-02 | Discharge: 2019-11-02 | Disposition: A | Discharge status: Home / Self Care | Payer: Self-pay | Attending: Family Medicine | Admitting: Family Medicine

## 2019-11-02 DIAGNOSIS — M2550 Pain in unspecified joint: Secondary | ICD-10-CM

## 2019-11-02 DIAGNOSIS — M1990 Unspecified osteoarthritis, unspecified site: Secondary | ICD-10-CM

## 2019-11-02 MED ORDER — MELOXICAM 15 MG PO TABS: 15.0000 mg | ORAL_TABLET | Freq: Every day | ORAL | 0 refills | Status: DC

## 2019-11-02 MED ORDER — METHYLPREDNISOLONE 4 MG PO TBPK: ORAL_TABLET | ORAL | 0 refills | Status: DC

## 2019-11-02 NOTE — ED Provider Notes (Signed)
[facility]    CSN: [id number] Arrival date & time: 11/02/19  1029      History   Chief Complaint Chief Complaint  Patient presents with  . Joint Pain    HPI Jamie Lee is a 39 y.o. female.   HPI  Chart reviewed  Had a prior visit for joint pain Followed up with PCP who ordered labs Rheum factor and sed rate moderately high Recommended follow up with rheumatology and orthopedics She states unable to afford so did neither  Now here for same symptoms Has had joint symptoms since adolescence Complains of hands, shoulders, low back and "pelvis"  No overuse or trauma noted   Past Medical History:  Diagnosis Date  . Arthritis   . Schnyder's crystalline corneal dystrophy   . Sciatica   . Vision abnormalities     There are no problems to display for this patient.   Past Surgical History:  Procedure Laterality Date  . CHOLECYSTECTOMY    . EYE SURGERY Bilateral     OB History   No obstetric history on file.      Home Medications    Prior to Admission medications   Medication Sig Start Date End Date Taking? Authorizing Provider  diclofenac sodium (VOLTAREN) 1 % GEL Apply 2 g topically 4 (four) times daily. 06/16/18   Gwyneth Sprout, MD  meloxicam (MOBIC) 15 MG tablet Take 1 tablet (15 mg total) by mouth daily. 11/02/19   Eustace Moore, MD  methylPREDNISolone (MEDROL DOSEPAK) 4 MG TBPK tablet tad 11/02/19   Eustace Moore, MD    Family History Family History  Problem Relation Age of Onset  . Diabetes Maternal Grandfather   . Blindness Maternal Grandfather     Social History Social History   Tobacco Use  . Smoking status: Never Smoker  . Smokeless tobacco: Never Used  Substance Use Topics  . Alcohol use: Yes    Comment: occ  . Drug use: No     Allergies   Patient has no known allergies.   Review of Systems Review of Systems  Constitutional: Negative for fever.  Musculoskeletal: Positive for arthralgias, back pain,  neck pain and neck stiffness. Negative for gait problem and joint swelling.  Skin: Negative for color change and rash.    Physical Exam Triage Vital Signs ED Triage Vitals  Enc Vitals Group     BP 11/02/19 1043 117/68     Pulse Rate 11/02/19 1043 81     Resp 11/02/19 1043 16     Temp --      Temp src --      SpO2 11/02/19 1043 100 %     Weight --      Height --      Head Circumference --      Peak Flow --      Pain Score 11/02/19 1045 8     Pain Loc --      Pain Edu? --      Excl. in GC? --    No data found.  Updated Vital Signs BP 117/68 (BP Location: Right Arm)   Pulse 81   Temp 99 F (37.2 C) (Oral)   Resp 16   LMP 09/30/2019 (Within Days)   SpO2 100%       Physical Exam Constitutional:      General: She is not in acute distress.    Appearance: Normal appearance. She is well-developed. She is obese.  HENT:     Head:  Normocephalic and atraumatic.     Mouth/Throat:     Comments: Mask is in place Eyes:     Conjunctiva/sclera: Conjunctivae normal.     Pupils: Pupils are equal, round, and reactive to light.  Cardiovascular:     Rate and Rhythm: Normal rate.  Pulmonary:     Effort: Pulmonary effort is normal. No respiratory distress.  Abdominal:     General: There is no distension.     Palpations: Abdomen is soft.  Musculoskeletal:        General: Normal range of motion.     Cervical back: Normal range of motion.     Comments: Both knees have patellofemoral crepitus with range of motion.  Joints of the upper extremity are examined, no synovitis, swelling, redness, or tenderness  Skin:    General: Skin is warm and dry.  Neurological:     Mental Status: She is alert.  Psychiatric:        Mood and Affect: Mood normal.        Behavior: Behavior normal.      UC Treatments / Results  Labs (all labs ordered are listed, but only abnormal results are displayed) Labs Reviewed - No data to display  EKG   Radiology No results  found.  Procedures Procedures (including critical care time)  Medications Ordered in UC Medications - No data to display  Initial Impression / Assessment and Plan / UC Course  I have reviewed the triage vital signs and the nursing notes.  Pertinent labs & imaging results that were available during my care of the patient were reviewed by me and considered in my medical decision making (see chart for details).     Reviewed that I can give her some prednisone and anti-inflammatories to help her symptoms, but she really needs to follow-up with rheumatology and orthopedics for her chronic pain problems.  She needs a primary care doctor.  She is going to work on getting health concerns that she can accomplish these goals Final Clinical Impressions(s) / UC Diagnoses   Final diagnoses:  Chronic inflammatory arthritis  Arthralgia, unspecified joint     Discharge Instructions     Take the Medrol Dosepak first.  Take all of day 1 today When finished with the Dosepak then you can take the meloxicam (Mobic).  Take 1 daily Follow-up with primary care  the Voltaren gel is available over-the-counter    ED Prescriptions    Medication Sig Dispense Auth. Provider   methylPREDNISolone (MEDROL DOSEPAK) 4 MG TBPK tablet  (Status: Discontinued) tad 21 tablet Raylene Everts, MD   meloxicam (MOBIC) 15 MG tablet  (Status: Discontinued) Take 1 tablet (15 mg total) by mouth daily. 30 tablet Raylene Everts, MD   methylPREDNISolone (MEDROL DOSEPAK) 4 MG TBPK tablet tad 21 tablet Raylene Everts, MD   meloxicam (MOBIC) 15 MG tablet Take 1 tablet (15 mg total) by mouth daily. 30 tablet Raylene Everts, MD     PDMP not reviewed this encounter.   Raylene Everts, MD 11/02/19 [id number]

## 2019-11-02 NOTE — ED Triage Notes (Signed)
C/o generalized joint pain. Denies any other symptoms. Hx arthritis.

## 2019-11-02 NOTE — Discharge Instructions (Addendum)
Take the Medrol Dosepak first.  Take all of day 1 today When finished with the Dosepak then you can take the meloxicam (Mobic).  Take 1 daily Follow-up with primary care  the Voltaren gel is available over-the-counter

## 2020-02-29 DIAGNOSIS — R300 Dysuria: Secondary | ICD-10-CM | POA: Diagnosis not present

## 2020-02-29 DIAGNOSIS — Z0000 Encounter for general adult medical examination without abnormal findings: Secondary | ICD-10-CM | POA: Diagnosis not present

## 2020-02-29 DIAGNOSIS — N8110 Cystocele, unspecified: Secondary | ICD-10-CM | POA: Diagnosis not present

## 2020-02-29 DIAGNOSIS — Z113 Encounter for screening for infections with a predominantly sexual mode of transmission: Secondary | ICD-10-CM | POA: Diagnosis not present

## 2020-02-29 DIAGNOSIS — Z124 Encounter for screening for malignant neoplasm of cervix: Secondary | ICD-10-CM | POA: Diagnosis not present

## 2020-02-29 DIAGNOSIS — Z3202 Encounter for pregnancy test, result negative: Secondary | ICD-10-CM | POA: Diagnosis not present

## 2020-03-07 DIAGNOSIS — R45851 Suicidal ideations: Secondary | ICD-10-CM | POA: Diagnosis not present

## 2020-03-07 DIAGNOSIS — F322 Major depressive disorder, single episode, severe without psychotic features: Secondary | ICD-10-CM | POA: Diagnosis not present

## 2020-03-07 DIAGNOSIS — F419 Anxiety disorder, unspecified: Secondary | ICD-10-CM | POA: Diagnosis not present

## 2020-03-07 DIAGNOSIS — Z800 Family history of malignant neoplasm of digestive organs: Secondary | ICD-10-CM | POA: Diagnosis not present

## 2020-03-21 DIAGNOSIS — R739 Hyperglycemia, unspecified: Secondary | ICD-10-CM | POA: Diagnosis not present

## 2020-03-21 DIAGNOSIS — M533 Sacrococcygeal disorders, not elsewhere classified: Secondary | ICD-10-CM | POA: Diagnosis not present

## 2020-03-21 DIAGNOSIS — Z0000 Encounter for general adult medical examination without abnormal findings: Secondary | ICD-10-CM | POA: Diagnosis not present

## 2020-03-21 DIAGNOSIS — M064 Inflammatory polyarthropathy: Secondary | ICD-10-CM | POA: Diagnosis not present

## 2020-03-21 DIAGNOSIS — Z13220 Encounter for screening for lipoid disorders: Secondary | ICD-10-CM | POA: Diagnosis not present

## 2020-03-21 DIAGNOSIS — Z791 Long term (current) use of non-steroidal anti-inflammatories (NSAID): Secondary | ICD-10-CM | POA: Diagnosis not present

## 2020-03-25 DIAGNOSIS — E1165 Type 2 diabetes mellitus with hyperglycemia: Secondary | ICD-10-CM | POA: Diagnosis not present

## 2020-03-26 DIAGNOSIS — M79642 Pain in left hand: Secondary | ICD-10-CM | POA: Diagnosis not present

## 2020-03-26 DIAGNOSIS — M25551 Pain in right hip: Secondary | ICD-10-CM | POA: Diagnosis not present

## 2020-03-26 DIAGNOSIS — M25559 Pain in unspecified hip: Secondary | ICD-10-CM | POA: Diagnosis not present

## 2020-03-26 DIAGNOSIS — M549 Dorsalgia, unspecified: Secondary | ICD-10-CM | POA: Diagnosis not present

## 2020-03-26 DIAGNOSIS — M79641 Pain in right hand: Secondary | ICD-10-CM | POA: Diagnosis not present

## 2020-03-26 DIAGNOSIS — M25552 Pain in left hip: Secondary | ICD-10-CM | POA: Diagnosis not present

## 2020-03-26 DIAGNOSIS — M064 Inflammatory polyarthropathy: Secondary | ICD-10-CM | POA: Diagnosis not present

## 2020-03-26 DIAGNOSIS — M533 Sacrococcygeal disorders, not elsewhere classified: Secondary | ICD-10-CM | POA: Diagnosis not present

## 2020-03-26 DIAGNOSIS — M25541 Pain in joints of right hand: Secondary | ICD-10-CM | POA: Diagnosis not present

## 2020-03-26 DIAGNOSIS — R700 Elevated erythrocyte sedimentation rate: Secondary | ICD-10-CM | POA: Diagnosis not present

## 2020-03-26 DIAGNOSIS — M25542 Pain in joints of left hand: Secondary | ICD-10-CM | POA: Diagnosis not present

## 2020-04-09 ENCOUNTER — Other Ambulatory Visit: Payer: Self-pay | Admitting: Rheumatology

## 2020-04-09 DIAGNOSIS — R700 Elevated erythrocyte sedimentation rate: Secondary | ICD-10-CM | POA: Diagnosis not present

## 2020-04-09 DIAGNOSIS — M461 Sacroiliitis, not elsewhere classified: Secondary | ICD-10-CM

## 2020-04-09 DIAGNOSIS — M549 Dorsalgia, unspecified: Secondary | ICD-10-CM | POA: Diagnosis not present

## 2020-04-09 DIAGNOSIS — M25559 Pain in unspecified hip: Secondary | ICD-10-CM | POA: Diagnosis not present

## 2020-04-09 DIAGNOSIS — M545 Low back pain, unspecified: Secondary | ICD-10-CM

## 2020-04-09 DIAGNOSIS — M064 Inflammatory polyarthropathy: Secondary | ICD-10-CM | POA: Diagnosis not present

## 2020-04-09 DIAGNOSIS — M47818 Spondylosis without myelopathy or radiculopathy, sacral and sacrococcygeal region: Secondary | ICD-10-CM

## 2020-04-22 DIAGNOSIS — L659 Nonscarring hair loss, unspecified: Secondary | ICD-10-CM | POA: Diagnosis not present

## 2020-04-22 DIAGNOSIS — F419 Anxiety disorder, unspecified: Secondary | ICD-10-CM | POA: Diagnosis not present

## 2020-04-22 DIAGNOSIS — E1165 Type 2 diabetes mellitus with hyperglycemia: Secondary | ICD-10-CM | POA: Diagnosis not present

## 2020-05-06 ENCOUNTER — Other Ambulatory Visit: Payer: Self-pay | Admitting: Rheumatology

## 2020-05-06 DIAGNOSIS — M47818 Spondylosis without myelopathy or radiculopathy, sacral and sacrococcygeal region: Secondary | ICD-10-CM

## 2020-05-06 DIAGNOSIS — M461 Sacroiliitis, not elsewhere classified: Secondary | ICD-10-CM

## 2020-05-06 DIAGNOSIS — M545 Low back pain, unspecified: Secondary | ICD-10-CM

## 2020-05-21 DIAGNOSIS — Z713 Dietary counseling and surveillance: Secondary | ICD-10-CM | POA: Diagnosis not present

## 2020-05-25 ENCOUNTER — Other Ambulatory Visit: Payer: Self-pay

## 2020-05-29 DIAGNOSIS — E282 Polycystic ovarian syndrome: Secondary | ICD-10-CM | POA: Diagnosis not present

## 2020-05-29 DIAGNOSIS — L659 Nonscarring hair loss, unspecified: Secondary | ICD-10-CM | POA: Diagnosis not present

## 2020-06-12 DIAGNOSIS — Z713 Dietary counseling and surveillance: Secondary | ICD-10-CM | POA: Diagnosis not present

## 2020-06-15 ENCOUNTER — Other Ambulatory Visit: Payer: Self-pay

## 2020-07-04 ENCOUNTER — Other Ambulatory Visit: Payer: Self-pay

## 2020-07-22 DIAGNOSIS — H60501 Unspecified acute noninfective otitis externa, right ear: Secondary | ICD-10-CM | POA: Diagnosis not present

## 2020-07-22 DIAGNOSIS — H9311 Tinnitus, right ear: Secondary | ICD-10-CM | POA: Diagnosis not present

## 2020-07-22 DIAGNOSIS — E1165 Type 2 diabetes mellitus with hyperglycemia: Secondary | ICD-10-CM | POA: Diagnosis not present

## 2020-08-22 DIAGNOSIS — H938X3 Other specified disorders of ear, bilateral: Secondary | ICD-10-CM | POA: Diagnosis not present

## 2020-08-22 DIAGNOSIS — H9311 Tinnitus, right ear: Secondary | ICD-10-CM | POA: Diagnosis not present

## 2020-08-22 DIAGNOSIS — M26609 Unspecified temporomandibular joint disorder, unspecified side: Secondary | ICD-10-CM | POA: Diagnosis not present

## 2020-08-22 DIAGNOSIS — H9313 Tinnitus, bilateral: Secondary | ICD-10-CM | POA: Diagnosis not present

## 2020-09-24 DIAGNOSIS — E282 Polycystic ovarian syndrome: Secondary | ICD-10-CM | POA: Diagnosis not present

## 2020-09-24 DIAGNOSIS — N8110 Cystocele, unspecified: Secondary | ICD-10-CM | POA: Diagnosis not present

## 2020-09-24 DIAGNOSIS — L659 Nonscarring hair loss, unspecified: Secondary | ICD-10-CM | POA: Diagnosis not present

## 2020-09-24 DIAGNOSIS — Z3041 Encounter for surveillance of contraceptive pills: Secondary | ICD-10-CM | POA: Diagnosis not present

## 2020-09-25 ENCOUNTER — Other Ambulatory Visit: Payer: Self-pay | Admitting: Obstetrics and Gynecology

## 2020-09-25 DIAGNOSIS — Z1231 Encounter for screening mammogram for malignant neoplasm of breast: Secondary | ICD-10-CM

## 2020-09-26 DIAGNOSIS — F411 Generalized anxiety disorder: Secondary | ICD-10-CM | POA: Diagnosis not present

## 2020-10-02 DIAGNOSIS — F411 Generalized anxiety disorder: Secondary | ICD-10-CM | POA: Diagnosis not present

## 2020-10-09 DIAGNOSIS — F411 Generalized anxiety disorder: Secondary | ICD-10-CM | POA: Diagnosis not present

## 2020-10-28 DIAGNOSIS — F411 Generalized anxiety disorder: Secondary | ICD-10-CM | POA: Diagnosis not present

## 2020-11-18 ENCOUNTER — Ambulatory Visit
Admission: RE | Admit: 2020-11-18 | Discharge: 2020-11-18 | Disposition: A | Discharge status: Home / Self Care | Payer: BC Managed Care – PPO | Source: Ambulatory Visit | Attending: Obstetrics and Gynecology | Admitting: Obstetrics and Gynecology

## 2020-11-18 ENCOUNTER — Other Ambulatory Visit: Payer: Self-pay

## 2020-11-18 DIAGNOSIS — Z1231 Encounter for screening mammogram for malignant neoplasm of breast: Secondary | ICD-10-CM

## 2020-11-20 DIAGNOSIS — F411 Generalized anxiety disorder: Secondary | ICD-10-CM | POA: Diagnosis not present

## 2020-11-26 DIAGNOSIS — L668 Other cicatricial alopecia: Secondary | ICD-10-CM | POA: Diagnosis not present

## 2020-11-26 DIAGNOSIS — L659 Nonscarring hair loss, unspecified: Secondary | ICD-10-CM | POA: Diagnosis not present

## 2020-11-28 DIAGNOSIS — F419 Anxiety disorder, unspecified: Secondary | ICD-10-CM | POA: Diagnosis not present

## 2020-11-28 DIAGNOSIS — E1165 Type 2 diabetes mellitus with hyperglycemia: Secondary | ICD-10-CM | POA: Diagnosis not present

## 2021-03-15 ENCOUNTER — Emergency Department (HOSPITAL_COMMUNITY)
Admission: EM | Admit: 2021-03-15 | Discharge: 2021-03-15 | Discharge status: Against Medical Advice | Payer: 59 | Attending: Emergency Medicine | Admitting: Emergency Medicine

## 2021-03-15 ENCOUNTER — Encounter (HOSPITAL_COMMUNITY): Payer: Self-pay | Admitting: *Deleted

## 2021-03-15 ENCOUNTER — Emergency Department (HOSPITAL_COMMUNITY): Payer: 59

## 2021-03-15 ENCOUNTER — Other Ambulatory Visit: Payer: Self-pay

## 2021-03-15 DIAGNOSIS — F419 Anxiety disorder, unspecified: Secondary | ICD-10-CM | POA: Insufficient documentation

## 2021-03-15 DIAGNOSIS — R0602 Shortness of breath: Secondary | ICD-10-CM | POA: Diagnosis not present

## 2021-03-15 DIAGNOSIS — R059 Cough, unspecified: Secondary | ICD-10-CM | POA: Insufficient documentation

## 2021-03-15 DIAGNOSIS — R0600 Dyspnea, unspecified: Secondary | ICD-10-CM

## 2021-03-15 LAB — BASIC METABOLIC PANEL
Anion gap: 10 (ref 5–15)
BUN: 10 mg/dL (ref 6–20)
CO2: 24 mmol/L (ref 22–32)
Calcium: 10.6 mg/dL — ABNORMAL HIGH (ref 8.9–10.3)
Chloride: 102 mmol/L (ref 98–111)
Creatinine, Ser: 0.75 mg/dL (ref 0.44–1.00)
GFR, Estimated: >60 mL/min (ref >60)
Glucose, Bld: 142 mg/dL — ABNORMAL HIGH (ref 70–99)
Potassium: 3.8 mmol/L (ref 3.5–5.1)
Sodium: 136 mmol/L (ref 135–145)

## 2021-03-15 LAB — CBC
HCT: 39.5 % (ref 36.0–46.0)
Hemoglobin: 13.1 g/dL (ref 12.0–15.0)
MCH: 31.3 pg (ref 26.0–34.0)
MCHC: 33.2 g/dL (ref 30.0–36.0)
MCV: 94.3 fL (ref 80.0–100.0)
Platelets: 213 10*3/uL (ref 150–400)
RBC: 4.19 MIL/uL (ref 3.87–5.11)
RDW: 12.4 % (ref 11.5–15.5)
WBC: 5.5 10*3/uL (ref 4.0–10.5)
nRBC: 0 % (ref 0.0–0.2)

## 2021-03-15 LAB — TROPONIN I (HIGH SENSITIVITY)
Troponin I (High Sensitivity): <2 ng/L (ref <18)
Troponin I (High Sensitivity): 3 ng/L (ref <18)

## 2021-03-15 LAB — I-STAT BETA HCG BLOOD, ED (MC, WL, AP ONLY): I-stat hCG, quantitative: <5 m[IU]/mL (ref <5)

## 2021-03-15 LAB — D-DIMER, QUANTITATIVE: D-Dimer, Quant: 0.37 ug/mL-FEU (ref 0.00–0.50)

## 2021-03-15 NOTE — ED Notes (Signed)
Pt tearful in room and requesting to be d/c; advised still pending labs and discussed risks of leaving AMA and benefits of staying; pt verbalized understanding; EDP made aware

## 2021-03-15 NOTE — Discharge Instructions (Addendum)
Return for any problem.  ?

## 2021-03-15 NOTE — ED Notes (Signed)
Pt off unit AMA

## 2021-03-15 NOTE — ED Provider Notes (Signed)
[facility] EMERGENCY DEPARTMENT Provider Note   CSN: [id number] Arrival date & time: 03/15/21  1842     History Chief Complaint  Patient presents with   Shortness of Breath    Jamie Lee is a 40 y.o. female.  40 year old female with prior medical history as detailed below presents for evaluation.  Patient reports that she has had a mild cough for the last 2 to 3 weeks.  She reports intermittent symptoms of anxiety.  She reports increased concern about her cough and her apparent intermittent shortness of breath.  She is anxious on my initial evaluation.  She denies any associated chest pain.  She denies fevers.  She denies productive cough.  She is initially agreeable with work-up and evaluation.  The history is provided by the patient.  Shortness of Breath Severity:  Mild Onset quality:  Gradual Duration:  2 weeks Timing:  Intermittent Progression:  Waxing and waning Chronicity:  New     Past Medical History:  Diagnosis Date   Arthritis    Schnyder's crystalline corneal dystrophy    Sciatica    Vision abnormalities     There are no problems to display for this patient.   Past Surgical History:  Procedure Laterality Date   CHOLECYSTECTOMY     EYE SURGERY Bilateral      OB History   No obstetric history on file.     Family History  Problem Relation Age of Onset   Diabetes Maternal Grandfather    Blindness Maternal Grandfather     Social History   Tobacco Use   Smoking status: Never   Smokeless tobacco: Never  Vaping Use   Vaping Use: Never used  Substance Use Topics   Alcohol use: Yes    Comment: occ   Drug use: No    Home Medications Prior to Admission medications   Medication Sig Start Date End Date Taking? Authorizing Provider  diclofenac sodium (VOLTAREN) 1 % GEL Apply 2 g topically 4 (four) times daily. 06/16/18   Gwyneth Sprout, MD  meloxicam (MOBIC) 15 MG tablet Take 1 tablet (15 mg total) by mouth daily.  11/02/19   Eustace Moore, MD  methylPREDNISolone (MEDROL DOSEPAK) 4 MG TBPK tablet tad 11/02/19   Eustace Moore, MD    Allergies    Patient has no known allergies.  Review of Systems   Review of Systems  Respiratory:  Positive for shortness of breath.   All other systems reviewed and are negative.  Physical Exam Updated Vital Signs BP (!) 140/100 (BP Location: Right Arm)   Pulse 90   Temp 98.7 F (37.1 C) (Oral)   Resp 19   Ht 5\' 7"  (1.702 m)   Wt 113.4 kg   LMP 02/12/2021   SpO2 100%   BMI 39.16 kg/m   Physical Exam Vitals and nursing note reviewed.  Constitutional:      General: She is not in acute distress.    Appearance: Normal appearance. She is well-developed.  HENT:     Head: Normocephalic and atraumatic.  Eyes:     Conjunctiva/sclera: Conjunctivae normal.     Pupils: Pupils are equal, round, and reactive to light.  Cardiovascular:     Rate and Rhythm: Normal rate and regular rhythm.     Heart sounds: Normal heart sounds.  Pulmonary:     Effort: Pulmonary effort is normal. No respiratory distress.     Breath sounds: Normal breath sounds.  Abdominal:     General: There  is no distension.     Palpations: Abdomen is soft.     Tenderness: There is no abdominal tenderness.  Musculoskeletal:        General: No deformity. Normal range of motion.     Cervical back: Normal range of motion and neck supple.  Skin:    General: Skin is warm and dry.  Neurological:     General: No focal deficit present.     Mental Status: She is alert and oriented to person, place, and time.    ED Results / Procedures / Treatments   Labs (all labs ordered are listed, but only abnormal results are displayed) Labs Reviewed  BASIC METABOLIC PANEL - Abnormal; Notable for the following components:      Result Value   Glucose, Bld 142 (*)    Calcium 10.6 (*)    All other components within normal limits  CBC  D-DIMER, QUANTITATIVE  I-STAT BETA HCG BLOOD, ED ([facility], [facility], [facility] ONLY)   TROPONIN I (HIGH SENSITIVITY)    EKG EKG Interpretation  Date/Time:  Saturday March 15 2021 18:53:51 EDT Ventricular Rate:  100 PR Interval:  174 QRS Duration: 78 QT Interval:  348 QTC Calculation: 448 R Axis:   -19 Text Interpretation: Normal sinus rhythm Possible Left atrial enlargement Borderline ECG Confirmed by Kristine Royal [phone]) on 03/15/2021 7:10:28 PM  Radiology DG Chest 2 View  Result Date: 03/15/2021 CLINICAL DATA:  Short of breath today.  Coughing. EXAM: CHEST - 2 VIEW COMPARISON:  None. FINDINGS: Normal heart, mediastinum and hila. Clear lungs.  No pleural effusion or pneumothorax. Skeletal structures are intact. IMPRESSION: No active cardiopulmonary disease. Electronically Signed   By: Amie Portland M.D.   On: 03/15/2021 19:31    Procedures Procedures   Medications Ordered in ED Medications - No data to display  ED Course  I have reviewed the triage vital signs and the nursing notes.  Pertinent labs & imaging results that were available during my care of the patient were reviewed by me and considered in my medical decision making (see chart for details).    MDM Rules/Calculators/A&P                           MDM  MSE complete  Jamie Lee was evaluated in Emergency Department on 03/15/2021 for the symptoms described in the history of present illness. She was evaluated in the context of the global COVID-19 pandemic, which necessitated consideration that the patient might be at risk for infection with the SARS-CoV-2 virus that causes COVID-19. Institutional protocols and algorithms that pertain to the evaluation of patients at risk for COVID-19 are in a state of rapid change based on information released by regulatory bodies including the CDC and federal and state organizations. These policies and algorithms were followed during the patient's care in the ED.  Patient initially presented with complaint of mild cough with associated intermittent dyspnea.   Patient is visibly anxious on my initial evaluation.  She did consent to initial work-up and evaluation.  Prior to completion of the work-up the patient reports that she is too anxious to stay in the ED.  She reports that other patients in the ED are making too much noise and making her anxiety worse.  She has capacity to refuse care.  She is advised that her work-up is not complete.  At time of her signing out AMA, a D-dimer is pending.  Mother completed work-up including a troponin with  well within normal limits.  Patient without objective evidence of significant medical pathology.  Patient is visibly anxious.  She is not expressing suicidality or homicidality.  There is no indication for IVC hold.  She also refused Covid testing today.  She is advised to closely follow-up with her regular care providers.  Strict return precautions given and understood.  Importance of close follow-up is repeatedly stressed.   Final Clinical Impression(s) / ED Diagnoses Final diagnoses:  Dyspnea, unspecified type  Anxiety    Rx / DC Orders ED Discharge Orders     None        Wynetta Fines, MD 03/15/21 2134

## 2021-03-15 NOTE — ED Triage Notes (Signed)
The pt is c/o chest pain and sob sob while she was at [employer]  guarded when I asked if she was upset over something  coughing  non-productive   lmp last month

## 2021-03-15 NOTE — ED Notes (Signed)
EDP at bedside to discuss POC and risks of leaving AMA; pt continues to report she wishes to leave as the ER is too noisy

## 2021-03-30 ENCOUNTER — Ambulatory Visit (HOSPITAL_COMMUNITY): Payer: Self-pay

## 2021-03-30 DIAGNOSIS — S46911A Strain of unspecified muscle, fascia and tendon at shoulder and upper arm level, right arm, initial encounter: Secondary | ICD-10-CM | POA: Diagnosis not present

## 2021-03-30 DIAGNOSIS — S29011A Strain of muscle and tendon of front wall of thorax, initial encounter: Secondary | ICD-10-CM | POA: Diagnosis not present

## 2021-03-30 DIAGNOSIS — W010XXA Fall on same level from slipping, tripping and stumbling without subsequent striking against object, initial encounter: Secondary | ICD-10-CM | POA: Diagnosis not present

## 2021-03-30 DIAGNOSIS — S161XXA Strain of muscle, fascia and tendon at neck level, initial encounter: Secondary | ICD-10-CM | POA: Diagnosis not present

## 2021-04-02 ENCOUNTER — Encounter (HOSPITAL_COMMUNITY): Payer: Self-pay | Admitting: Emergency Medicine

## 2021-04-02 ENCOUNTER — Ambulatory Visit (HOSPITAL_COMMUNITY)
Admission: EM | Admit: 2021-04-02 | Discharge: 2021-04-02 | Disposition: A | Discharge status: Home / Self Care | Payer: 59 | Attending: Internal Medicine | Admitting: Internal Medicine

## 2021-04-02 DIAGNOSIS — J069 Acute upper respiratory infection, unspecified: Secondary | ICD-10-CM

## 2021-04-02 DIAGNOSIS — U071 COVID-19: Secondary | ICD-10-CM | POA: Insufficient documentation

## 2021-04-02 DIAGNOSIS — R059 Cough, unspecified: Secondary | ICD-10-CM | POA: Diagnosis not present

## 2021-04-02 DIAGNOSIS — Z79899 Other long term (current) drug therapy: Secondary | ICD-10-CM | POA: Insufficient documentation

## 2021-04-02 DIAGNOSIS — F411 Generalized anxiety disorder: Secondary | ICD-10-CM | POA: Insufficient documentation

## 2021-04-02 DIAGNOSIS — Z1152 Encounter for screening for COVID-19: Secondary | ICD-10-CM

## 2021-04-02 LAB — POCT RAPID STREP A, ED / UC: Streptococcus, Group A Screen (Direct): NEGATIVE

## 2021-04-02 MED ORDER — BENZONATATE 100 MG PO CAPS: 100.0000 mg | ORAL_CAPSULE | Freq: Three times a day (TID) | ORAL | 0 refills | Status: DC | PRN

## 2021-04-02 NOTE — ED Triage Notes (Signed)
Pt is present today with a cough, body aches, sore throat, and dizziness. Pt states that has had the cough for several months. Pt states that when she coughs she feels a tightness in her chest. Pt states that her dizziness started this morning it feels like she is spinning

## 2021-04-02 NOTE — ED Provider Notes (Signed)
Marland Kitchen [id number] [facility]    CSN: [id number] Arrival date & time: 04/02/21  1751      History   Chief Complaint Chief Complaint  Patient presents with   Cough   Sore Throat   Generalized Body Aches   Dizziness    HPI Jamie Lee is a 40 y.o. female presents urgent care today with multiple complaints.  Patient reports cough for several months that improved slightly last week but now back.  She was actually seen in the emergency department 8/20 for similar complaints.  Prior to leaving AMA work-up revealed negative chest x-ray, D-dimer and troponin.  Patient left AMA due to increasing anxiety about being in the ED.  She was seen at urgent care 3 days ago for fall and prescribed muscle relaxer for generalized neck and shoulder pain.  She states this helps her sleep but offers no relief of pain.  Describes dizziness, sharp pains in throat, chills.  She denies any fever, abdominal pain, vomiting, diarrhea, dysuria.  Patient extremely anxious on exam.  States she has a long history of anxiety though she feels like she is able to control without medication.   Past Medical History:  Diagnosis Date   Arthritis    Schnyder's crystalline corneal dystrophy    Sciatica    Vision abnormalities     There are no problems to display for this patient.   Past Surgical History:  Procedure Laterality Date   CHOLECYSTECTOMY     EYE SURGERY Bilateral     OB History   No obstetric history on file.      Home Medications    Prior to Admission medications   Medication Sig Start Date End Date Taking? Authorizing Provider  benzonatate (TESSALON PERLES) 100 MG capsule Take 1 capsule (100 mg total) by mouth 3 (three) times daily as needed for cough. 04/02/21 04/02/22 Yes Rolla Etienne, NP  cyclobenzaprine (FLEXERIL) 5 MG tablet Take 5 mg by mouth 3 (three) times daily as needed. 03/30/21   [provider]  diclofenac sodium (VOLTAREN) 1 % GEL Apply 2 g topically 4 (four) times  daily. 06/16/18   Gwyneth Sprout, MD  JUNEL FE 1.5/30 1.5-30 MG-MCG tablet Take 1 tablet by mouth daily. 03/23/21   [provider]  meloxicam (MOBIC) 15 MG tablet Take 1 tablet (15 mg total) by mouth daily. 11/02/19   Eustace Moore, MD  metFORMIN (GLUCOPHAGE) 500 MG tablet Take 1 tablet by mouth daily. 07/25/20   [provider]  methylPREDNISolone (MEDROL DOSEPAK) 4 MG TBPK tablet tad 11/02/19   Eustace Moore, MD  sertraline (ZOLOFT) 100 MG tablet Take 100 mg by mouth daily. 12/27/20   [provider]    Family History Family History  Problem Relation Age of Onset   Diabetes Maternal Grandfather    Blindness Maternal Grandfather     Social History Social History   Tobacco Use   Smoking status: Never   Smokeless tobacco: Never  Vaping Use   Vaping Use: Never used  Substance Use Topics   Alcohol use: Yes    Comment: occ   Drug use: No     Allergies   Patient has no known allergies.   Review of Systems As stated in HPI otherwise negative   Physical Exam Triage Vital Signs ED Triage Vitals  Enc Vitals Group     BP 04/02/21 1124 120/84     Pulse Rate 04/02/21 1124 (!) 128     Resp 04/02/21 1124  20     Temp 04/02/21 1126 100.1 F (37.8 C)     Temp Source 04/02/21 1126 Oral     SpO2 04/02/21 1124 98 %     Weight --      Height --      Head Circumference --      Peak Flow --      Pain Score 04/02/21 1125 7     Pain Loc --      Pain Edu? --      Excl. in GC? --    No data found.  Updated Vital Signs BP 120/84   Pulse (!) 110   Temp 100.1 F (37.8 C) (Oral)   Resp 20   SpO2 98%   Visual Acuity Right Eye Distance:   Left Eye Distance:   Bilateral Distance:    Right Eye Near:   Left Eye Near:    Bilateral Near:     Physical Exam Constitutional:      General: She is not in acute distress.    Appearance: She is well-developed. She is obese. She is not ill-appearing or toxic-appearing.  HENT:     Right Ear: A middle  ear effusion is present. Tympanic membrane is not erythematous.     Left Ear: A middle ear effusion is present. Tympanic membrane is not erythematous.     Nose: Congestion present. No rhinorrhea.     Mouth/Throat:     Mouth: Mucous membranes are moist. No oral lesions.     Pharynx: Posterior oropharyngeal erythema present. No oropharyngeal exudate or uvula swelling.     Tonsils: No tonsillar exudate or tonsillar abscesses.  Eyes:     Conjunctiva/sclera: Conjunctivae normal.  Cardiovascular:     Rate and Rhythm: Normal rate and regular rhythm.     Heart sounds: No murmur heard.   No friction rub. No gallop.  Pulmonary:     Effort: Pulmonary effort is normal.     Breath sounds: Normal breath sounds. No wheezing, rhonchi or rales.  Abdominal:     General: Bowel sounds are normal.     Palpations: Abdomen is soft.  Musculoskeletal:     Cervical back: Normal range of motion and neck supple.  Lymphadenopathy:     Cervical: No cervical adenopathy.  Skin:    General: Skin is warm and dry.  Neurological:     General: No focal deficit present.     Mental Status: She is alert and oriented to person, place, and time.  Psychiatric:     Comments: Very anxous     UC Treatments / Results  Labs (all labs ordered are listed, but only abnormal results are displayed) Labs Reviewed  SARS CORONAVIRUS 2 (TAT 6-24 HRS)  CULTURE, GROUP A STREP [facility])  POCT RAPID STREP A, ED / UC    EKG   Radiology No results found.  Procedures Procedures (including critical care time)  Medications Ordered in UC Medications - No data to display  Initial Impression / Assessment and Plan / UC Course  I have reviewed the triage vital signs and the nursing notes.  Pertinent labs & imaging results that were available during my care of the patient were reviewed by me and considered in my medical decision making (see chart for details).  Viral URI c cough Anxiety -Nontoxic-appearing, low-grade fever  tachycardia though suspect exacerbated by significant anxiety -Cough ongoing times several months.  Chest x-ray done during recent ED visit unremarkable -Rapid strep negative.  Will send for culture -  COVID 19 screening with follow-up and isolation precautions discussed -Cetirizine, Flonase, Tessalon Perles as needed -Long discussion regarding patient's anxiety.  Patient feel strongly she does not need treatment for anxiety and is able to manage -Follow-up for any worsening or persistent symptoms  Reviewed expections re: course of current medical issues. Questions answered. Outlined signs and symptoms indicating need for more acute intervention. Pt verbalized understanding. AVS given  Final Clinical Impressions(s) / UC Diagnoses   Final diagnoses:  Viral URI with cough  Encounter for screening for COVID-19  Anxiety state     Discharge Instructions      Your rapid strep test today was negative.  I will send this to be cultured.  Urgent care will call you should you need any antibiotic treatment. COVID testing ordered. It will take between 1-2 days for test results. Someone will contact you regarding abnormal results or you can access your results through MyChart.   In the meantime you should... Remain isolated in your home for 5 days from symptom onset AND greater than 48 hours of no fever without the use of fever-reducing medication Get plenty of rest and fluids Tessalon Perles prescribed for cough Flonase for nasal congestion and/or runny nose You can take OTC Zyrtec-D for nasal congestion, runny nose, and/or sore throat Use these medications as directed for symptom relief Use Tylenol or Ibuprofen as needed for fever or pain Return or go to the ER for any worsening or new symptoms such as high fever, worsening cough, shortness of breath, chest tightness, chest pain, changes in mental status, etc.      ED Prescriptions     Medication Sig Dispense Auth. Provider    benzonatate (TESSALON PERLES) 100 MG capsule Take 1 capsule (100 mg total) by mouth 3 (three) times daily as needed for cough. 30 capsule Rolla Etienne, NP      PDMP not reviewed this encounter.   Rolla Etienne, NP 04/02/21 1316

## 2021-04-02 NOTE — Discharge Instructions (Addendum)
Your rapid strep test today was negative.  I will send this to be cultured.  Urgent care will call you should you need any antibiotic treatment. COVID testing ordered. It will take between 1-2 days for test results. Someone will contact you regarding abnormal results or you can access your results through MyChart.   In the meantime you should... Remain isolated in your home for 5 days from symptom onset AND greater than 48 hours of no fever without the use of fever-reducing medication Get plenty of rest and fluids Tessalon Perles prescribed for cough Flonase for nasal congestion and/or runny nose You can take OTC Zyrtec-D for nasal congestion, runny nose, and/or sore throat Use these medications as directed for symptom relief Use Tylenol or Ibuprofen as needed for fever or pain Return or go to the ER for any worsening or new symptoms such as high fever, worsening cough, shortness of breath, chest tightness, chest pain, changes in mental status, etc.

## 2021-04-03 LAB — SARS CORONAVIRUS 2 (TAT 6-24 HRS): SARS Coronavirus 2: POSITIVE — AB

## 2021-04-04 DIAGNOSIS — U071 COVID-19: Secondary | ICD-10-CM | POA: Diagnosis not present

## 2021-04-04 DIAGNOSIS — R0602 Shortness of breath: Secondary | ICD-10-CM | POA: Diagnosis not present

## 2021-04-05 LAB — CULTURE, GROUP A STREP (THRC)

## 2021-04-23 DIAGNOSIS — N921 Excessive and frequent menstruation with irregular cycle: Secondary | ICD-10-CM | POA: Diagnosis not present

## 2021-04-23 DIAGNOSIS — N8110 Cystocele, unspecified: Secondary | ICD-10-CM | POA: Diagnosis not present

## 2021-04-28 DIAGNOSIS — N921 Excessive and frequent menstruation with irregular cycle: Secondary | ICD-10-CM | POA: Diagnosis not present

## 2021-05-08 DIAGNOSIS — N898 Other specified noninflammatory disorders of vagina: Secondary | ICD-10-CM | POA: Diagnosis not present

## 2021-05-08 DIAGNOSIS — R102 Pelvic and perineal pain: Secondary | ICD-10-CM | POA: Diagnosis not present

## 2021-05-08 DIAGNOSIS — K5900 Constipation, unspecified: Secondary | ICD-10-CM | POA: Diagnosis not present

## 2021-05-08 DIAGNOSIS — Z01419 Encounter for gynecological examination (general) (routine) without abnormal findings: Secondary | ICD-10-CM | POA: Diagnosis not present

## 2021-05-22 DIAGNOSIS — N898 Other specified noninflammatory disorders of vagina: Secondary | ICD-10-CM | POA: Diagnosis not present

## 2022-01-22 ENCOUNTER — Ambulatory Visit: Payer: 59 | Attending: Internal Medicine | Admitting: Internal Medicine

## 2022-01-22 ENCOUNTER — Encounter: Payer: Self-pay | Admitting: Internal Medicine

## 2022-01-22 VITALS — BP 118/81 | HR 107 | Ht 67.0 in | Wt 264.2 lb

## 2022-01-22 DIAGNOSIS — E1169 Type 2 diabetes mellitus with other specified complication: Secondary | ICD-10-CM | POA: Diagnosis not present

## 2022-01-22 DIAGNOSIS — F321 Major depressive disorder, single episode, moderate: Secondary | ICD-10-CM | POA: Diagnosis not present

## 2022-01-22 DIAGNOSIS — F411 Generalized anxiety disorder: Secondary | ICD-10-CM | POA: Diagnosis not present

## 2022-01-22 DIAGNOSIS — Z7689 Persons encountering health services in other specified circumstances: Secondary | ICD-10-CM | POA: Diagnosis not present

## 2022-01-22 LAB — POCT GLYCOSYLATED HEMOGLOBIN (HGB A1C): HbA1c, POC (controlled diabetic range): 12.2 % — AB (ref 0.0–7.0)

## 2022-01-22 MED ORDER — GLIMEPIRIDE 4 MG PO TABS: 4.0000 mg | ORAL_TABLET | Freq: Every day | ORAL | 3 refills | Status: DC

## 2022-01-22 MED ORDER — ACCU-CHEK SOFTCLIX LANCETS MISC: 12 refills | Status: AC

## 2022-01-22 MED ORDER — ESCITALOPRAM OXALATE 5 MG PO TABS: 5.0000 mg | ORAL_TABLET | Freq: Every day | ORAL | 1 refills | Status: DC

## 2022-01-22 MED ORDER — ACCU-CHEK GUIDE VI STRP
ORAL_STRIP | 12 refills | Status: AC
Start: 2022-01-22 — End: ?

## 2022-01-22 MED ORDER — ACCU-CHEK GUIDE W/DEVICE KIT: PACK | 0 refills | Status: AC

## 2022-01-22 NOTE — Progress Notes (Signed)
Patient ID: Jamie Lee, female    DOB: [birth year]  MRN: [id number]  CC: new pt visit   Subjective: Jamie Lee is a 41 y.o. 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  Decided she wanted an MD Pt with hx of DM, anxiety disorder and arthritis.   Dx with DM about 2 yrs ago -was on Metformin but she d/c because of GI upset including diarrhea. Off med for 1 yr.  Endorses polyuria/polydipsia. Some blurred vision. Last eye exam 4 mths ago.  Results for orders placed or performed in visit on 01/22/22  POCT glycosylated hemoglobin (Hb A1C)  Result Value Ref Range   Hemoglobin A1C     HbA1c POC (<> result, manual entry)     HbA1c, POC (prediabetic range)     HbA1c, POC (controlled diabetic range) 12.2 (A) 0.0 - 7.0 %     Anx: was on Zoloft.  Helped 80% but it made her body too tired.  She was taking it sometimes in a.m and sometimes in p.m. She was suppose to be on 100 mg daily but she self increased to 200 mg and she felt the 100 mg was not working well.  She subsequently spoke with her PCP at that time and it sounds as though she changed the dose to 150 mg.. Stopped it 2-3 mths ago due to the medicine making her feel tired.  She was on it for about 4 mths before she stopped taking it.   Anxiety fluctuates throughout the day.  She tries to control it. Endorses some depression which she engages as moderate.  Both dep/anx got worse after mom died 13-Oct-2018 then stress of pandemic.  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  No SI at this time but did at one point. Has 3 kids.  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    Current Outpatient Medications on File Prior to Visit  Medication Sig Dispense Refill   diclofenac sodium (VOLTAREN) 1 % GEL Apply 2 g topically 4 (four) times daily. 100 g 1   JUNEL FE 1.5/30 1.5-30 MG-MCG tablet Take 1 tablet by mouth daily.     No current  facility-administered medications on file prior to visit.    No Known Allergies  Social History   Socioeconomic History   Marital status: Married    Spouse name: Not on file   Number of children: Not on file   Years of education: Not on file   Highest education level: Not on file  Occupational History   Not on file  Tobacco Use   Smoking status: Never   Smokeless tobacco: Never  Vaping Use   Vaping Use: Never used  Substance and Sexual Activity   Alcohol use: Yes    Comment: occ   Drug use: No   Sexual activity: Not on file  Other Topics Concern   Not on file  Social History Narrative   Not on file   Social Determinants of Health   Financial Resource Strain: Not on file  Food Insecurity: Not on file  Transportation Needs: Not on file  Physical Activity: Not on file  Stress: Not on file  Social Connections: Not on file  Intimate Partner Violence: Not on file    Family History  Problem Relation Age of Onset   Diabetes Maternal Grandfather    Blindness Maternal Grandfather  Past Surgical History:  Procedure Laterality Date   CHOLECYSTECTOMY     EYE SURGERY Bilateral     ROS: Review of Systems Negative except as stated above  PHYSICAL EXAM: BP 118/81   Pulse (!) 107   Ht 5\' 7"  (1.702 m)   Wt 264 lb 3.2 oz (119.8 kg)   SpO2 99%   BMI 41.38 kg/m   Physical Exam  General appearance - alert, well appearing, morbidly obese middle-age African-American female and in no distress.   Mental status - Patient is noted to constantly shake her right leg.  She appears nervous.  She is tearful at times.  Goes off in tangents and has to be redirected. Neck - supple, no significant adenopathy Chest - clear to auscultation, no wheezes, rales or rhonchi, symmetric air entry Heart - normal rate, regular rhythm, normal S1, S2, no murmurs, rubs, clicks or gallops Extremities - peripheral pulses normal, no pedal edema, no clubbing or cyanosis     01/22/2022    2:38  PM 07/01/2018   12:24 PM  GAD 7 : Generalized Anxiety Score  Nervous, Anxious, on Edge 3 3  Control/stop worrying 3 3  Worry too much - different things 3 3  Trouble relaxing 3 3  Restless 3 2  Easily annoyed or irritable 3 3  Afraid - awful might happen 3 3  Total GAD 7 Score 21 20       01/22/2022    2:38 PM 07/01/2018   12:24 PM  Depression screen PHQ 2/9  Decreased Interest 2 2  Down, Depressed, Hopeless 2 2  PHQ - 2 Score 4 4  Altered sleeping 3 3  Tired, decreased energy 3 3  Change in appetite 3 3  Feeling bad or failure about yourself  2 1  Trouble concentrating 3 2  Moving slowly or fidgety/restless 3 1  Suicidal thoughts 0 0  PHQ-9 Score 21 17       Latest Ref Rng & Units 03/15/2021    7:00 PM 04/17/2016    8:56 PM 05/22/2010   12:05 PM  CMP  Glucose 70 - 99 mg/dL 05/24/2010  381  829   BUN 6 - 20 mg/dL 10  12  9    Creatinine 0.44 - 1.00 mg/dL 937   1.69   Sodium 135 - 145 mmol/L 136  137  139   Potassium 3.5 - 5.1 mmol/L 3.8  3.6  4.4   Chloride 98 - 111 mmol/L 102  105  104   CO2 22 - 32 mmol/L 24  25  28    Calcium 8.9 - 10.3 mg/dL 6.78  9.5  9.7   Total Protein 6.5 - 8.1 g/dL  7.2    Total Bilirubin 0.3 - 1.2 mg/dL  0.8    Alkaline Phos 38 - 126 U/L  57    AST 15 - 41 U/L  21    ALT 14 - 54 U/L  21     Lipid Panel  No results found for: "CHOL", "TRIG", "HDL", "CHOLHDL", "VLDL", "LDLCALC", "LDLDIRECT"  CBC    Component Value Date/Time   WBC 5.5 03/15/2021 1900   RBC 4.19 03/15/2021 1900   HGB 13.1 03/15/2021 1900   HCT 39.5 03/15/2021 1900   PLT 213 03/15/2021 1900   MCV 94.3 03/15/2021 1900   MCH 31.3 03/15/2021 1900   MCHC 33.2 03/15/2021 1900   RDW 12.4 03/15/2021 1900   LYMPHSABS 1.7 05/22/2010 1205   MONOABS 0.4 05/22/2010 1205  EOSABS 0.2 05/22/2010 1205   BASOSABS 0.0 05/22/2010 1205    ASSESSMENT AND PLAN: 1. Encounter to establish care   2. Type 2 diabetes mellitus with morbid obesity (HCC) Uncontrolled. Discussed the  importance of healthy eating habits, regular aerobic exercise (at least 150 minutes a week as tolerated) and medication compliance to achieve or maintain control of diabetes. Patient advised to eliminate sugary drinks from the diet, cut back on portion sizes especially of white carbohydrates, eat more white lean meat like chicken [place] and seafood instead of beef or pork and incorporate fresh fruits and vegetables into the diet daily. -Given her A1c, I recommend starting her on insulin to get blood sugars down fairly quickly.  Patient became mentally distraught at the thought of having to do injections.  She declined insulin.  Also discussed trying her with Trulicity instead which is a once weekly injection and will help with weight loss.  Patient declined that as well.  She would prefer oral medication.  Advised that the oral medicines will not lower the blood sugar quickly enough.  However we went ahead and started her on Amaryl 4 mg daily.  Encouraged her to check blood sugars at least once a day before breakfast with goal being 90-130.  Went over signs and symptoms of hypoglycemia and how to treat. - POCT glycosylated hemoglobin (Hb A1C) - glimepiride (AMARYL) 4 MG tablet; Take 1 tablet (4 mg total) by mouth daily before breakfast.  Dispense: 30 tablet; Refill: 3 - Amb ref to Medical Nutrition Therapy-MNT - CBC - Comprehensive metabolic panel - Lipid panel - Microalbumin / creatinine urine ratio  3. GAD (generalized anxiety disorder) 4. Major depressive disorder, single episode, moderate (HCC) -Discussed management of depression and anxiety.  She feels she would benefit from being on medication again.  We discussed trying her with a different medication called Lexapro.  Advised to take it at night if it makes her tired.  Advised that it can take about 4 weeks before she starts feeling better on the medication and before we increase the dose.  Be seen in the emergency room if any suicidal ideation  develops.  She is agreeable to referral to behavioral health. - escitalopram (LEXAPRO) 5 MG tablet; Take 1 tablet (5 mg total) by mouth daily.  Dispense: 30 tablet; Refill: 1 - Ambulatory referral to Psychiatry     Patient was given the opportunity to ask questions.  Patient verbalized understanding of the plan and was able to repeat key elements of the plan.   This documentation was completed using Paediatric nurse.  Any transcriptional errors are unintentional.  Orders Placed This Encounter  Procedures   CBC   Comprehensive metabolic panel   Lipid panel   Microalbumin / creatinine urine ratio   Ambulatory referral to Psychiatry   Amb ref to Medical Nutrition Therapy-MNT   POCT glycosylated hemoglobin (Hb A1C)     Requested Prescriptions   Signed Prescriptions Disp Refills   escitalopram (LEXAPRO) 5 MG tablet 30 tablet 1    Sig: Take 1 tablet (5 mg total) by mouth daily.   glimepiride (AMARYL) 4 MG tablet 30 tablet 3    Sig: Take 1 tablet (4 mg total) by mouth daily before breakfast.    Return in about 2 months (around 03/24/2022).  Jonah Blue, MD, FACP

## 2022-01-22 NOTE — Patient Instructions (Addendum)
I will send a prescription to your pharmacy for diabetic testing supplies.  Please check your blood sugars at least once a day before breakfast with goal being 90-130.  Please keep a log of your blood sugar readings and bring them with you on your next visit.  Diabetes Mellitus and Nutrition, Adult When you have diabetes, or diabetes mellitus, it is very important to have healthy eating habits because your blood sugar (glucose) levels are greatly affected by what you eat and drink. Eating healthy foods in the right amounts, at about the same times every day, can help you: Manage your blood glucose. Lower your risk of heart disease. Improve your blood pressure. Reach or maintain a healthy weight. What can affect my meal plan? Every person with diabetes is different, and each person has different needs for a meal plan. Your health care provider may recommend that you work with a dietitian to make a meal plan that is best for you. Your meal plan may vary depending on factors such as: The calories you need. The medicines you take. Your weight. Your blood glucose, blood pressure, and cholesterol levels. Your activity level. Other health conditions you have, such as heart or kidney disease. How do carbohydrates affect me? Carbohydrates, also called carbs, affect your blood glucose level more than any other type of food. Eating carbs raises the amount of glucose in your blood. It is important to know how many carbs you can safely have in each meal. This is different for every person. Your dietitian can help you calculate how many carbs you should have at each meal and for each snack. How does alcohol affect me? Alcohol can cause a decrease in blood glucose (hypoglycemia), especially if you use insulin or take certain diabetes medicines by mouth. Hypoglycemia can be a life-threatening condition. Symptoms of hypoglycemia, such as sleepiness, dizziness, and confusion, are similar to symptoms of having too  much alcohol. Do not drink alcohol if: Your health care provider tells you not to drink. You are pregnant, may be pregnant, or are planning to become pregnant. If you drink alcohol: Limit how much you have to: 0-1 drink a day for women. 0-2 drinks a day for men. Know how much alcohol is in your drink. In the [place] one drink equals one 12 oz bottle of beer (355 mL), one 5 oz glass of wine (148 mL), or one 1 oz glass of hard liquor (44 mL). Keep yourself hydrated with water, diet soda, or unsweetened iced tea. Keep in mind that regular soda, juice, and other mixers may contain a lot of sugar and must be counted as carbs. What are tips for following this plan?  Reading food labels Start by checking the serving size on the Nutrition Facts label of packaged foods and drinks. The number of calories and the amount of carbs, fats, and other nutrients listed on the label are based on one serving of the item. Many items contain more than one serving per package. Check the total grams (g) of carbs in one serving. Check the number of grams of saturated fats and trans fats in one serving. Choose foods that have a low amount or none of these fats. Check the number of milligrams (mg) of salt (sodium) in one serving. Most people should limit total sodium intake to less than 2,300 mg per day. Always check the nutrition information of foods labeled as "low-fat" or "nonfat." These foods may be higher in added sugar or refined carbs and should be  avoided. Talk to your dietitian to identify your daily goals for nutrients listed on the label. Shopping Avoid buying canned, pre-made, or processed foods. These foods tend to be high in fat, sodium, and added sugar. Shop around the outside edge of the grocery store. This is where you will most often find fresh fruits and vegetables, bulk grains, fresh meats, and fresh dairy products. Cooking Use low-heat cooking methods, such as baking, instead of high-heat cooking  methods, such as deep frying. Cook using healthy oils, such as olive, canola, or sunflower oil. Avoid cooking with butter, cream, or high-fat meats. Meal planning Eat meals and snacks regularly, preferably at the same times every day. Avoid going long periods of time without eating. Eat foods that are high in fiber, such as fresh fruits, vegetables, beans, and whole grains. Eat 4-6 oz (112-168 g) of lean protein each day, such as lean meat, chicken, fish, eggs, or tofu. One ounce (oz) (28 g) of lean protein is equal to: 1 oz (28 g) of meat, chicken, or fish. 1 egg.  cup (62 g) of tofu. Eat some foods each day that contain healthy fats, such as avocado, nuts, seeds, and fish. What foods should I eat? Fruits Berries. Apples. Oranges. Peaches. Apricots. Plums. Grapes. Mangoes. Papayas. Pomegranates. Kiwi. Cherries. Vegetables Leafy greens, including lettuce, spinach, kale, chard, collard greens, mustard greens, and cabbage. Beets. Cauliflower. Broccoli. Carrots. Green beans. Tomatoes. Peppers. Onions. Cucumbers. Brussels sprouts. Grains Whole grains, such as whole-wheat or whole-grain bread, crackers, tortillas, cereal, and pasta. Unsweetened oatmeal. Quinoa. Brown or wild rice. Meats and other proteins Seafood. Poultry without skin. Lean cuts of poultry and beef. Tofu. Nuts. Seeds. Dairy Low-fat or fat-free dairy products such as milk, yogurt, and cheese. The items listed above may not be a complete list of foods and beverages you can eat and drink. Contact a dietitian for more information. What foods should I avoid? Fruits Fruits canned with syrup. Vegetables Canned vegetables. Frozen vegetables with butter or cream sauce. Grains Refined white flour and flour products such as bread, pasta, snack foods, and cereals. Avoid all processed foods. Meats and other proteins Fatty cuts of meat. Poultry with skin. Breaded or fried meats. Processed meat. Avoid saturated fats. Dairy Full-fat  yogurt, cheese, or milk. Beverages Sweetened drinks, such as soda or iced tea. The items listed above may not be a complete list of foods and beverages you should avoid. Contact a dietitian for more information. Questions to ask a health care provider Do I need to meet with a certified diabetes care and education specialist? Do I need to meet with a dietitian? What number can I call if I have questions? When are the best times to check my blood glucose? Where to find more information: American Diabetes Association: diabetes.org Academy of Nutrition and Dietetics: eatright.[employer] of Diabetes and Digestive and Kidney Diseases: StageSync.si Association of Diabetes Care & Education Specialists: diabeteseducator.org Summary It is important to have healthy eating habits because your blood sugar (glucose) levels are greatly affected by what you eat and drink. It is important to use alcohol carefully. A healthy meal plan will help you manage your blood glucose and lower your risk of heart disease. Your health care provider may recommend that you work with a dietitian to make a meal plan that is best for you. This information is not intended to replace advice given to you by your health care provider. Make sure you discuss any questions you have with your health care provider.  Document Revised: 02/14/2020 Document Reviewed: 02/14/2020 Elsevier Patient Education  [place].

## 2022-01-22 NOTE — Progress Notes (Signed)
Discuss medications A1C-12.2

## 2022-01-23 LAB — LIPID PANEL
Chol/HDL Ratio: 2.5 ratio (ref 0.0–4.4)
Cholesterol, Total: 181 mg/dL (ref 100–199)
HDL: 71 mg/dL (ref >39)
LDL Chol Calc (NIH): 88 mg/dL (ref 0–99)
Triglycerides: 128 mg/dL (ref 0–149)
VLDL Cholesterol Cal: 22 mg/dL (ref 5–40)

## 2022-01-23 LAB — COMPREHENSIVE METABOLIC PANEL
ALT: 20 IU/L (ref 0–32)
AST: 19 IU/L (ref 0–40)
Albumin/Globulin Ratio: 1.6 (ref 1.2–2.2)
Albumin: 4.6 g/dL (ref 3.8–4.8)
Alkaline Phosphatase: 70 IU/L (ref 44–121)
BUN/Creatinine Ratio: 15 (ref 9–23)
BUN: 12 mg/dL (ref 6–24)
Bilirubin Total: 0.4 mg/dL (ref 0.0–1.2)
CO2: 21 mmol/L (ref 20–29)
Calcium: 10.3 mg/dL — ABNORMAL HIGH (ref 8.7–10.2)
Chloride: 99 mmol/L (ref 96–106)
Creatinine, Ser: 0.78 mg/dL (ref 0.57–1.00)
Globulin, Total: 2.8 g/dL (ref 1.5–4.5)
Glucose: 329 mg/dL — ABNORMAL HIGH (ref 70–99)
Potassium: 4.5 mmol/L (ref 3.5–5.2)
Sodium: 138 mmol/L (ref 134–144)
Total Protein: 7.4 g/dL (ref 6.0–8.5)
eGFR: 98 mL/min/{1.73_m2} (ref >59)

## 2022-01-23 LAB — CBC
Hematocrit: 44.6 % (ref 34.0–46.6)
Hemoglobin: 14.8 g/dL (ref 11.1–15.9)
MCH: 30.9 pg (ref 26.6–33.0)
MCHC: 33.2 g/dL (ref 31.5–35.7)
MCV: 93 fL (ref 79–97)
Platelets: 172 10*3/uL (ref 150–450)
RBC: 4.79 x10E6/uL (ref 3.77–5.28)
RDW: 12.3 % (ref 11.7–15.4)
WBC: 5.6 10*3/uL (ref 3.4–10.8)

## 2022-02-23 ENCOUNTER — Ambulatory Visit: Payer: Self-pay | Admitting: *Deleted

## 2022-02-23 DIAGNOSIS — F321 Major depressive disorder, single episode, moderate: Secondary | ICD-10-CM

## 2022-02-23 DIAGNOSIS — F411 Generalized anxiety disorder: Secondary | ICD-10-CM

## 2022-02-23 NOTE — Telephone Encounter (Signed)
  Chief Complaint: wants Lexapro increased Symptoms: anxious later in day Frequency: everyday Pertinent Negatives: Patient denies side effects Disposition: [] ED /[] Urgent Care (no appt availability in office) / [] Appointment(In office/virtual)/ []  [facility]/ [] Home Care/ [] Refused Recommended Disposition /[] [facility]/ [x]  Follow-up with PCP Additional Notes: Pt states she is much better, less anxious on Lexapro but now is getting anxious in late afternoon and evening. She has a rx of Lexapro 5 at pharm but she has not picked it up. She states Dr asked to call back to let her know if not working. She wants to emphasize it is working but seems to wear off. Pt asking for higher dose or to take twice a day or whatever the MD advises. She just wants to be sure the MD knows she has not picked up the rx yet in case it can be changed.  Reason for Disposition  [1] Caller has NON-URGENT medicine question about med that PCP prescribed AND [2] triager unable to answer question  Answer Assessment - Initial Assessment Questions 1. NAME of MEDICINE: "What medicine(s) are you calling about?"     Lexapro 5mg  2. QUESTION: "What is your question?" (e.g., double dose of medicine, side effect)     Can you increase it? 3. PRESCRIBER: "Who prescribed the medicine?" Reason: if prescribed by specialist, call should be referred to that group.     Dr. 4. SYMPTOMS: "Do you have any symptoms?" If Yes, ask: "What symptoms are you having?"  "How bad are the symptoms (e.g., mild, moderate, severe)     Feels better but now  anxious in the late afternoon and evening.  Protocols used: Medication Question Call-A-AH, Medication Refill and Renewal Call-A-AH

## 2022-02-24 MED ORDER — ESCITALOPRAM OXALATE 10 MG PO TABS: 10.0000 mg | ORAL_TABLET | Freq: Every day | ORAL | 5 refills | Status: DC

## 2022-02-24 NOTE — Telephone Encounter (Signed)
Please advise 

## 2022-02-24 NOTE — Addendum Note (Signed)
Addended by: Jonah Blue B on: 02/24/2022 05:17 PM   Modules accepted: Orders

## 2022-02-25 ENCOUNTER — Other Ambulatory Visit: Payer: Self-pay | Admitting: Obstetrics and Gynecology

## 2022-02-25 DIAGNOSIS — Z1231 Encounter for screening mammogram for malignant neoplasm of breast: Secondary | ICD-10-CM

## 2022-03-02 NOTE — Telephone Encounter (Signed)
Called patient and left voicemail, medication was increased

## 2022-03-05 ENCOUNTER — Ambulatory Visit
Admission: RE | Admit: 2022-03-05 | Discharge: 2022-03-05 | Disposition: A | Discharge status: Home / Self Care | Payer: 59 | Source: Ambulatory Visit | Attending: Obstetrics and Gynecology | Admitting: Obstetrics and Gynecology

## 2022-03-05 DIAGNOSIS — Z1231 Encounter for screening mammogram for malignant neoplasm of breast: Secondary | ICD-10-CM

## 2022-03-11 ENCOUNTER — Encounter (HOSPITAL_COMMUNITY): Payer: Self-pay | Admitting: Licensed Clinical Social Worker

## 2022-03-11 ENCOUNTER — Ambulatory Visit (INDEPENDENT_AMBULATORY_CARE_PROVIDER_SITE_OTHER): Payer: Self-pay | Admitting: Licensed Clinical Social Worker

## 2022-03-11 DIAGNOSIS — F4323 Adjustment disorder with mixed anxiety and depressed mood: Secondary | ICD-10-CM

## 2022-03-12 ENCOUNTER — Encounter (HOSPITAL_COMMUNITY): Payer: Self-pay

## 2022-03-12 NOTE — Plan of Care (Signed)
revise
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
## 2022-04-08 ENCOUNTER — Encounter: Payer: 59 | Admitting: Registered"

## 2022-04-08 ENCOUNTER — Encounter: Payer: 59 | Attending: Internal Medicine | Admitting: Registered"

## 2022-04-15 ENCOUNTER — Telehealth: Payer: 59 | Admitting: Registered"

## 2022-04-23 ENCOUNTER — Encounter (HOSPITAL_COMMUNITY): Payer: Self-pay | Admitting: Licensed Clinical Social Worker

## 2022-04-23 ENCOUNTER — Ambulatory Visit (INDEPENDENT_AMBULATORY_CARE_PROVIDER_SITE_OTHER): Payer: 59 | Admitting: Licensed Clinical Social Worker

## 2022-04-23 DIAGNOSIS — F4323 Adjustment disorder with mixed anxiety and depressed mood: Secondary | ICD-10-CM | POA: Diagnosis not present

## 2022-04-23 NOTE — Plan of Care (Signed)
  Problem: Anxiety Disorder  Goal:  Reduce overall frequency, intensity, and duration of the anxiety so that daily functioning is not impaired per pt self report 3 out of 5 sessions documented.   Outcome: Not Progressing Goal: Enhance ability to effectively cope with the full variety of life's worries and anxiety per self report 3 out of 5 sessions documented  Outcome: Not Progressing Goal: Decrease depressive symptoms and improve levels of effective functioning-pt reports a decrease in overall depression symptoms 3 out of 5 sessions documented.  Outcome: Not Progressing Intervention: Encourage verbalization of feelings/concerns/expectations Note: Explored  Intervention: Assist with relaxation techniques, as appropriate (deep breathing exercises, meditation, guided imagery) Note: Reviewed  Intervention: REVIEW PLEASE SKILLS (TREAT PHYSICAL ILLNESS, BALANCE EATING, AVOID MOOD-ALTERING SUBSTANCES, BALANCE SLEEP AND GET EXERCISE) WITH Kahmya Note: Reviewed  Intervention: EDUCATE [place] ON ANGER MANAGEMENT SKILLS AND THE RATIONALE FOR LEARNING THESE SKILLS Note: Explored/provided resources

## 2022-04-23 NOTE — Patient Instructions (Signed)
[place]:  [phone]  For immediate 24/7 assistance with mental health and substance abuse issues: Call our 24-hour HelpLine at [phone] or [phone]) [phone]. Walk in to [facility] ED, [facility]  ED, or the emergency department at [facility] for a prompt in-person crisis assessment.
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
## 2022-04-30 ENCOUNTER — Ambulatory Visit: Payer: 59 | Attending: Internal Medicine | Admitting: Internal Medicine

## 2022-04-30 DIAGNOSIS — F321 Major depressive disorder, single episode, moderate: Secondary | ICD-10-CM

## 2022-04-30 DIAGNOSIS — E1165 Type 2 diabetes mellitus with hyperglycemia: Secondary | ICD-10-CM | POA: Diagnosis not present

## 2022-04-30 DIAGNOSIS — Z2821 Immunization not carried out because of patient refusal: Secondary | ICD-10-CM

## 2022-04-30 DIAGNOSIS — F411 Generalized anxiety disorder: Secondary | ICD-10-CM

## 2022-04-30 DIAGNOSIS — Z7984 Long term (current) use of oral hypoglycemic drugs: Secondary | ICD-10-CM

## 2022-04-30 MED ORDER — GLIMEPIRIDE 4 MG PO TABS: 4.0000 mg | ORAL_TABLET | Freq: Every day | ORAL | 3 refills | Status: DC

## 2022-04-30 MED ORDER — ESCITALOPRAM OXALATE 20 MG PO TABS: 20.0000 mg | ORAL_TABLET | Freq: Every day | ORAL | 2 refills | Status: DC

## 2022-04-30 NOTE — Progress Notes (Signed)
Virtual Visit via Video Note  I connected with Jamie Lee on 04/30/2022 at 2:15 PM by a video enabled telemedicine application and verified that I am speaking with the correct person using two identifiers.  Location: Patient: home Provider: Office   I discussed the limitations of evaluation and management by telemedicine and the availability of in person appointments. The patient expressed understanding and agreed to proceed.  History of Present Illness: Patient with history of DM type II, anxiety, MDD.  Last visit was 01/22/2022.  Purpose of today's visit was for follow-up on diabetes, GAD, MDD.   A1c on last visit was 12.2.  Patient declined insulin and Trulicity.  We placed on Amaryl.  She reports that she did okay with blood sugars coming down from the 200s to around 170.  However 2 months ago she stopped checking blood sugars, stop exercising and has not been as careful with her eating habits.  She tells me that she has a lot going on and is dealing with some stress related to her son.  She is still taking the Amaryl.  She has an appointment coming up with the nutritionist later this month which she intends to keep. -Reports having had an eye exam earlier this year in March at [facility].  In regards to the depression and anxiety, she started seeing a therapist.  She has had 2 sessions so far and is finding it helpful.  She is currently on Lexapro 10 mg.  She feels it was helping at first but not as much now.  She thinks it is more so due to what she is going through than to the medication.  Denies any suicidal ideation.  HM: She declines the flu shot. Outpatient Encounter Medications as of 04/30/2022  Medication Sig   Accu-Chek Softclix Lancets lancets Use as instructed   Blood Glucose Monitoring Suppl (ACCU-CHEK GUIDE) w/Device KIT UAD   diclofenac sodium (VOLTAREN) 1 % GEL Apply 2 g topically 4 (four) times daily.   escitalopram (LEXAPRO) 10 MG tablet Take 1 tablet (10 mg  total) by mouth daily.   glimepiride (AMARYL) 4 MG tablet Take 1 tablet (4 mg total) by mouth daily before breakfast.   glucose blood (ACCU-CHEK GUIDE) test strip Use as instructed   JUNEL FE 1.5/30 1.5-30 MG-MCG tablet Take 1 tablet by mouth daily.   No facility-administered encounter medications on file as of 04/30/2022.      Observations/Objective: 33 African-American female in NAD  Assessment and Plan: 1. Type 2 diabetes mellitus with hyperglycemia, without long-term current use of insulin (HCC) Discussed the importance of trying to get back on track with her eating habits and exercise for better control of her diabetes.  Also encouraged her to start checking blood sugars again.  We will get her in with the clinical pharmacist in about 3 weeks.  Advised to bring her blood sugar logs with her.  At this time no change made in medications since we do not have any blood sugar readings to goal on.  We will plan to do A1c when she sees the clinical pharmacist. -Encouraged her to keep upcoming appointment with nutritionist for dietary counseling. - glimepiride (AMARYL) 4 MG tablet; Take 1 tablet (4 mg total) by mouth daily before breakfast.  Dispense: 30 tablet; Refill: 3  2. GAD (generalized anxiety disorder) Discussed increasing the Lexapro from 10 mg daily to 20 mg daily given that initially the medicine had helped at the lower dose.  She is agreeable to  doing this.  She will continue seeing her therapist. - escitalopram (LEXAPRO) 20 MG tablet; Take 1 tablet (20 mg total) by mouth daily.  Dispense: 30 tablet; Refill: 2  3. Major depressive disorder, single episode, moderate (HCC) See #2 above - escitalopram (LEXAPRO) 20 MG tablet; Take 1 tablet (20 mg total) by mouth daily.  Dispense: 30 tablet; Refill: 2  4. Influenza vaccination declined Recommended.  Patient declined.   Follow Up Instructions: 2 mths F/u with clinical pharmacist in 3 wks    I discussed the assessment and  treatment plan with the patient. The patient was provided an opportunity to ask questions and all were answered. The patient agreed with the plan and demonstrated an understanding of the instructions.   The patient was advised to call back or seek an in-person evaluation if the symptoms worsen or if the condition fails to improve as anticipated.  I spent 16 minutes dedicated to the care of this patient on the date of this encounter to include previsit review of of my last note and patient health maintenance, face-to-face time with patient discussing diagnosis and management, and postvisit placement of orders.  This note has been created with Surveyor, quantity. Any transcriptional errors are unintentional.  Karle Plumber, MD

## 2022-05-12 ENCOUNTER — Encounter: Payer: 59 | Attending: Internal Medicine | Admitting: Registered"

## 2022-06-02 ENCOUNTER — Other Ambulatory Visit: Payer: Self-pay

## 2022-06-02 ENCOUNTER — Ambulatory Visit: Payer: Self-pay | Attending: Internal Medicine | Admitting: Pharmacist

## 2022-06-02 ENCOUNTER — Ambulatory Visit (INDEPENDENT_AMBULATORY_CARE_PROVIDER_SITE_OTHER): Payer: 59 | Admitting: Licensed Clinical Social Worker

## 2022-06-02 ENCOUNTER — Encounter: Payer: Self-pay | Admitting: Pharmacist

## 2022-06-02 DIAGNOSIS — E1165 Type 2 diabetes mellitus with hyperglycemia: Secondary | ICD-10-CM

## 2022-06-02 DIAGNOSIS — F4323 Adjustment disorder with mixed anxiety and depressed mood: Secondary | ICD-10-CM

## 2022-06-02 LAB — POCT GLYCOSYLATED HEMOGLOBIN (HGB A1C): HbA1c, POC (controlled diabetic range): 9.6 % — AB (ref 0.0–7.0)

## 2022-06-02 MED ORDER — TRULICITY 0.75 MG/0.5ML ~~LOC~~ SOAJ: 0.7500 mg | SUBCUTANEOUS | 0 refills | Status: DC
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
## 2022-06-02 NOTE — Progress Notes (Signed)
S:     No chief complaint on file.  41 y.o. female who presents for diabetes evaluation, education, and management.  PMH is significant for T2DM, MDD, GAD.  Patient was referred and last seen by Primary Care Provider, Dr. Laural Benes, on 04/30/2022. That visit occurred via video visit. Pt was encouraged to come to see me in person to have her A1c checked. Of note, pt declined injectable therapy at that visit.   Today, patient arrives in good spirits and presents without any assistance.  Patient reports Diabetes was diagnosed in 2021-2022. She cannot recall exactly. She tells me she was dx by having her blood work done at an outpatient clinic. She has never been hospitalized for DM. No personal or family hx of thyroid cancer. No personal hx of pancreatitis. She denies ever having been dx with clinical ASCVD, CHF, or CKD.   Family/Social History:  -Fhx: heart disease, DM, kidney disease -Tobacco: never smoker -Alcohol: none   Current diabetes medications include: glimepiride 4 mg daily Current hypertension medications include: none Current hyperlipidemia medications include: none  Patient reports adherence to taking all medications as prescribed.   Insurance coverage: none  Patient denies hypoglycemic events.  Reported home fasting blood sugars: not checking   Reported 2 hour post-meal/random blood sugars: not checking.  Patient denies nocturia (nighttime urination).  Patient denies neuropathy (nerve pain). Patient denies visual changes. Patient reports self foot exams.   Patient reported dietary habits:  -Knows the right things to eat but admits to dietary indiscretion -Tells me today that she has battles with GAD and is an emotional eater  -She admits to eating whatever she can  Patient-reported exercise habits: none   O:   ROS  Physical Exam  7 day average blood glucose: no meter   No CGM in place.   Lab Results  Component Value Date   HGBA1C 9.6 (A)  06/02/2022   There were no vitals filed for this visit.  Lipid Panel     Component Value Date/Time   CHOL 181 01/22/2022 1557   TRIG 128 01/22/2022 1557   HDL 71 01/22/2022 1557   CHOLHDL 2.5 01/22/2022 1557   LDLCALC 88 01/22/2022 1557    Clinical Atherosclerotic Cardiovascular Disease (ASCVD): No  The 10-year ASCVD risk score (Arnett DK, et al., 2019) is: 0.5%   Values used to calculate the score:     Age: 27 years     Sex: Female     Is Non-Hispanic African American: Yes     Diabetic: Yes     Tobacco smoker: No     Systolic Blood Pressure: 118 mmHg     Is BP treated: No     HDL Cholesterol: 71 mg/dL     Total Cholesterol: 181 mg/dL   A/P: Diabetes longstanding currently uncontrolled based on A1c today but improved. Patient is able to verbalize appropriate hypoglycemia management plan. Medication adherence appears appropriate. She has previous intolerance to metformin and does not wish to add insulin. She is not symptomatic today and after a good discussion she is amenable to adding Trulicity.  -Continued glimepiride at current dosing for now.  -Started Trulicity 0.75 mg weekly.  -Patient was educated on the use of the Trulicity pen. Reviewed necessary supplies and operation of the pen. Also reviewed goal blood glucose levels. Patient was able to demonstrate use. - Encouraged pt to start checking CBG at home.    -Patient educated on purpose, proper use, and potential adverse effects of Trulicity.  -  Extensively discussed pathophysiology of diabetes, recommended lifestyle interventions, dietary effects on blood sugar control.  -Counseled on s/sx of and management of hypoglycemia.  -POCT A1c  Written patient instructions provided. Patient verbalized understanding of treatment plan.  Total time in face to face counseling 30 minutes.    Follow-up:  Pharmacist in 1-1.5 months.  Benard Halsted, PharmD, [place], [place] [phone]

## 2022-06-02 NOTE — Plan of Care (Signed)
  Problem: Anxiety Disorder  Goal:  Reduce overall frequency, intensity, and duration of the anxiety so that daily functioning is not impaired per pt self report 3 out of 5 sessions documented.   Outcome: Not Progressing Goal: Enhance ability to effectively cope with the full variety of life's worries and anxiety per self report 3 out of 5 sessions documented  Outcome: Progressing Goal: Decrease depressive symptoms and improve levels of effective functioning-pt reports a decrease in overall depression symptoms 3 out of 5 sessions documented.  Outcome: Progressing Intervention: Encourage verbalization of feelings/concerns/expectations Note: Allowed pt to explore/express Intervention: Assist with coping skills and behavior Note: Reviewed  Intervention: REVIEW PLEASE SKILLS (TREAT PHYSICAL ILLNESS, BALANCE EATING, AVOID MOOD-ALTERING SUBSTANCES, BALANCE SLEEP AND GET EXERCISE) WITH Verlisa Note: Reviewed  Intervention: EDUCATE Saira ON ANGER MANAGEMENT SKILLS AND THE RATIONALE FOR LEARNING THESE SKILLS Note: Reviewed

## 2022-06-12 ENCOUNTER — Telehealth: Payer: Self-pay

## 2022-06-12 NOTE — Telephone Encounter (Signed)
Trulicity prior authorization approved until 06/03/2023. Pharmacy notified.

## 2022-06-30 ENCOUNTER — Ambulatory Visit: Payer: Self-pay | Admitting: Pharmacist

## 2022-06-30 ENCOUNTER — Ambulatory Visit: Payer: 59 | Admitting: Internal Medicine

## 2022-07-05 ENCOUNTER — Other Ambulatory Visit: Payer: Self-pay | Admitting: Internal Medicine

## 2022-07-05 DIAGNOSIS — F411 Generalized anxiety disorder: Secondary | ICD-10-CM

## 2022-07-05 DIAGNOSIS — F321 Major depressive disorder, single episode, moderate: Secondary | ICD-10-CM

## 2022-07-13 ENCOUNTER — Ambulatory Visit (HOSPITAL_COMMUNITY): Payer: 59 | Admitting: Licensed Clinical Social Worker

## 2022-07-14 ENCOUNTER — Ambulatory Visit (INDEPENDENT_AMBULATORY_CARE_PROVIDER_SITE_OTHER): Payer: 59 | Admitting: Licensed Clinical Social Worker

## 2022-07-14 DIAGNOSIS — F4323 Adjustment disorder with mixed anxiety and depressed mood: Secondary | ICD-10-CM | POA: Diagnosis not present

## 2022-07-14 NOTE — Progress Notes (Signed)
  Virtual Visit via Video  Note  I connected with Jezabella Schriever on 07/14/22 at 11:00 AM EST by a video enabled telemedicine application and verified that I am speaking with the correct person using two identifiers.  Location: Patient: home Provider: remote office [place], [place])   I discussed the limitations of evaluation and management by telemedicine and the availability of in person appointments. The patient expressed understanding and agreed to proceed.   I discussed the assessment and treatment plan with the patient. The patient was provided an opportunity to ask questions and all were answered. The patient agreed with the plan and demonstrated an understanding of the instructions.   The patient was advised to call back or seek an in-person evaluation if the symptoms worsen or if the condition fails to improve as anticipated.  I provided 45 minutes of non-face-to-face time during this encounter.   Johanan Skorupski R Ramello Cordial, LCSW  THERAPIST PROGRESS NOTE  Session Time: 815-9a  Participation Level: Active  Behavioral Response: NeatAnxious, Depressed, and Hopeless  Type of Therapy: Individual Therapy  Treatment Goals addressed:Problem: Anxiety Disorder  Goal:  Reduce overall frequency, intensity, and duration of the anxiety so that daily functioning is not impaired per pt self report 3 out of 5 sessions documented.   Outcome: Not Progressing Goal: Enhance ability to effectively cope with the full variety of life's worries and anxiety per self report 3 out of 5 sessions documented  Outcome: Progressing Goal: Decrease depressive symptoms and improve levels of effective functioning-pt reports a decrease in overall depression symptoms 3 out of 5 sessions documented.  Outcome: Progressing Intervention: Encourage verbalization of feelings/concerns/expectations Note: Allowed pt to explore/express Intervention: Assist with coping skills and behavior Note: Reviewed  Intervention: REVIEW  PLEASE SKILLS (TREAT PHYSICAL ILLNESS, BALANCE EATING, AVOID MOOD-ALTERING SUBSTANCES, BALANCE SLEEP AND GET EXERCISE) WITH Christell Note: Reviewed  Intervention: EDUCATE Andelyn ON ANGER MANAGEMENT SKILLS AND THE RATIONALE FOR LEARNING THESE SKILLS Note: Reviewed   ProgressTowards Goals: Progressing  Interventions: Solution Focused, Supportive, Anger Management Training, and Family Systems  Summary: Amanpreet Delmont is a 41 y.o. female who presents with continuing symptoms related to adjustment disorder w/ depression and anxiety sxs.   Continued recommendations are as follows: self care behaviors, positive social engagements, focusing on overall work/home/life balance, and focusing on positive physical and emotional wellness.   Suicidal/Homicidal: No  Therapist Response: Pt is continuing to apply interventions learned in session into daily life situations. Pt is currently on track to meet goals utilizing interventions mentioned above. Personal growth and progress noted. Treatment to continue as indicated.   Plan: Return again in 4 weeks.  Diagnosis:  Encounter Diagnosis  Name Primary?   Adjustment disorder with mixed anxiety and depressed mood Yes   Collaboration of Care: Other n/a at time of session  Patient/Guardian was advised Release of Information must be obtained prior to any record release in order to collaborate their care with an outside provider. Patient/Guardian was advised if they have not already done so to contact the registration department to sign all necessary forms in order for [place] to release information regarding their care.   Consent: Patient/Guardian gives verbal consent for treatment and assignment of benefits for services provided during this visit. Patient/Guardian expressed understanding and agreed to proceed.   Ernest Haber Zofia Peckinpaugh, LCSW 07/14/2022

## 2022-07-28 ENCOUNTER — Ambulatory Visit: Payer: Self-pay

## 2022-07-28 NOTE — Telephone Encounter (Signed)
Note from RN reviewed.  Disposition is appropriate.

## 2022-07-28 NOTE — Telephone Encounter (Signed)
Chief Complaint: abd pain after taking Amoxicillin Symptoms: mid abdomen to lower abdomen "gas pain" worse after eating has ahd 3 doses of Amoxicillin Frequency: yesterday Pertinent Negatives: Patient denies distension, vomiting, diarrhea Disposition: [] ED /[] Urgent Care (no appt availability in office) / [x] Appointment(In office/virtual)/ []  [facility] [facility]/ [] Home Care/ [] Refused Recommended Disposition /[]  Mobile Bus/ []  Follow-up with PCP Additional Notes: pt stated unable to come in office. VV made tomorrow. Advised to call back or ED if worsens. Pt stated she had the same abd pain and discomfort when taken amoxicillin in the past. Reason for Disposition  [1] MODERATE pain (e.g., interferes with normal activities) AND [2] pain comes and goes (cramps) AND [3] present > 24 hours  (Exception: Pain with Vomiting or Diarrhea - see that Guideline.)  Answer Assessment - Initial Assessment Questions 1. LOCATION: "Where does it hurt?"      Middle of stomach to bottom  2. RADIATION: "Does the pain shoot anywhere else?" (e.g., chest, back)     no 3. ONSET: "When did the pain begin?" (e.g., minutes, hours or days ago)      Wilburn Mylar- started abx on Sunday night missed Monday am dose and took one yesterday then this am total of 3 pills 4. SUDDEN: "Gradual or sudden onset?"     suddenly 5. PATTERN "Does the pain come and go, or is it constant?"    - If it comes and goes: "How long does it last?" "Do you have pain now?"     (Note: Comes and goes means the pain is intermittent. It goes away completely between bouts.)    - If constant: "Is it getting better, staying the same, or getting worse?"      (Note: Constant means the pain never goes away completely; most serious pain is constant and gets worse.)      Worse after eating 6. SEVERITY: "How bad is the pain?"  (e.g., Scale 1-10; mild, moderate, or severe)    - MILD (1-3): Doesn't interfere with normal activities, abdomen soft  and not tender to touch.     - MODERATE (4-7): Interferes with normal activities or awakens from sleep, abdomen tender to touch.     - SEVERE (8-10): Excruciating pain, doubled over, unable to do any normal activities.       Hard to stand up straight like gas yesterday- moderate 7. RECURRENT SYMPTOM: "Have you ever had this type of stomach pain before?" If Yes, ask: "When was the last time?" and "What happened that time?"      With Amoxicillin  8. CAUSE: "What do you think is causing the stomach pain?"     Amoxicillin- for sinus infection 9. RELIEVING/AGGRAVATING FACTORS: "What makes it better or worse?" (e.g., antacids, bending or twisting motion, bowel movement)     After eating feels like cramps and pressure 10. OTHER SYMPTOMS: "Do you have any other symptoms?" (e.g., back pain, diarrhea, fever, urination pain, vomiting)       cough 11. PREGNANCY: "Is there any chance you are pregnant?" "When was your last menstrual period?"       N/a  Answer Assessment - Initial Assessment Questions 1. NAME of MEDICINE: "What medicine(s) are you calling about?"     amoxicillin 2. QUESTION: "What is your question?" (e.g., double dose of medicine, side effect)     Side effect 3. PRESCRIBER: "Who prescribed the medicine?" Reason: if prescribed by specialist, call should be referred to that group.     Telehealth visit 4. SYMPTOMS: "  Do you have any symptoms?" If Yes, ask: "What symptoms are you having?"  "How bad are the symptoms (e.g., mild, moderate, severe)     Abd pain with pressure and gas- pt stated she had same sx after previously taking it. 5. PREGNANCY:  "Is there any chance that you are pregnant?" "When was your last menstrual period?"     N/a  Protocols used: Abdominal Pain - Female-A-AH, Medication Question Call-A-AH

## 2022-07-29 ENCOUNTER — Telehealth: Payer: Self-pay | Admitting: Physician Assistant

## 2022-07-30 ENCOUNTER — Ambulatory Visit: Payer: Self-pay | Admitting: *Deleted

## 2022-07-30 MED ORDER — AZITHROMYCIN 250 MG PO TABS: ORAL_TABLET | ORAL | 0 refills | Status: AC

## 2022-07-30 NOTE — Addendum Note (Signed)
Addended by: Karle Plumber B on: 07/30/2022 09:27 PM   Modules accepted: Orders

## 2022-07-30 NOTE — Telephone Encounter (Signed)
Summary: Med management.   Pt stated she was scheduled for an appointment for yesterday; however, she forgot about the appointment and missed it. Pt stated she felt very tired and fell asleep after taking the medication promethazine-dextromethorphan (PROMETHAZINE-DM) 6.25-15 mg/5 mL syrup stated took 2 table spoons yesterday morning.  Pt seeking clinical advice stated that she would like to speak with a nurse to discuss her medication as she still has concerns.      Chief Complaint: Medication Issue Symptoms: Pt took 2 tablespoons of Promethazine DM yesterday, slept through VV appt.scheduled. Advised of appropriate dose, pt read dosage on prescription bottle. Verbalizes understanding of correct dosage, 5-37mls. Advised 68mls = 1 teaspoon.  Frequency: Yesterday Pertinent Negatives: Patient denies sleepiness presently Disposition: [] ED /[] Urgent Care (no appt availability in office) / [] Appointment(In office/virtual)/ []  [facility]/ [] Home Care/ [] Refused Recommended Disposition /[] [place]/ [x]  Follow-up with PCP Additional Notes:  Pt states last took Amoxicillin yesterday, VV was to address side affects from ATBs. Please advise.   Reason for Disposition  Prescription request for new medicine (not a refill)  Answer Assessment - Initial Assessment Questions 1. NAME of MEDICINE: "What medicine(s) are you calling about?"     Promethazine DM 2. QUESTION: "What is your question?" (e.g., double dose of medicine, side effect)     "Made me very sleepy." 3. PRESCRIBER: "Who prescribed the medicine?" Reason: if prescribed by specialist, call should be referred to that group.     PCP  Protocols used: Medication Question Call-A-AH

## 2022-07-30 NOTE — Telephone Encounter (Signed)
Patient diagnosed with sinus infection and bronchitis by Urgent Care. She was given Amoxicillin and Promethazine-dextromethorphan.  She was taking the promethazine incorrectly which caused her to miss her appt on yesterday. She was asleep.  Patient states Amoxicillin gives her really bad abdominal pain to the point where she is unable to tolerate it. Wanted to know if she could be prescribed another ATB.   Advised patient to call Urgent Care to see if they can give another Rx (ATB).   If unable to send prescription, please advise to schedule appointment.

## 2022-07-31 NOTE — Telephone Encounter (Signed)
Patient aware and appreciative for the new ATB was called into pharmacy.

## 2022-08-02 ENCOUNTER — Other Ambulatory Visit: Payer: Self-pay | Admitting: Internal Medicine

## 2022-08-02 DIAGNOSIS — E1165 Type 2 diabetes mellitus with hyperglycemia: Secondary | ICD-10-CM

## 2022-08-03 NOTE — Telephone Encounter (Signed)
Requested Prescriptions  Pending Prescriptions Disp Refills   glimepiride (AMARYL) 4 MG tablet [Pharmacy Med Name: GLIMEPIRIDE 4MG  TABLETS] 90 tablet     Sig: TAKE 1 TABLET(4 MG) BY MOUTH DAILY BEFORE BREAKFAST     Endocrinology:  Diabetes - Sulfonylureas Failed - 08/02/2022 10:45 AM      Failed - HBA1C is between 0 and 7.9 and within 180 days    HbA1c, POC (controlled diabetic range)  Date Value Ref Range Status  06/02/2022 9.6 (A) 0.0 - 7.0 % Final         Passed - Cr in normal range and within 360 days    Creatinine, Ser  Date Value Ref Range Status  01/22/2022 0.78 0.57 - 1.00 mg/dL Final         Passed - Valid encounter within last 6 months    Recent Outpatient Visits           2 months ago Type 2 diabetes mellitus with hyperglycemia, without long-term current use of insulin [facility])   [place], Annie Main L, RPH-CPP   3 months ago Type 2 diabetes mellitus with hyperglycemia, without long-term current use of insulin [facility])   [place], MD   6 months ago Encounter to establish care   [place] Ladell Pier, MD   4 years ago Encounter to establish care   [facility], Carroll Sage, FNP       Future Appointments             In 1 month Wynetta Emery Dalbert Batman, MD [place]   In 1 month Daisy Blossom, Jarome Matin, [place]

## 2022-08-25 ENCOUNTER — Ambulatory Visit (HOSPITAL_COMMUNITY): Payer: 59 | Admitting: Licensed Clinical Social Worker

## 2022-08-27 ENCOUNTER — Ambulatory Visit: Payer: Self-pay | Admitting: *Deleted

## 2022-08-27 NOTE — Telephone Encounter (Signed)
Follow-up call made to patient . Patient voiced that she feels  better. Her anxiety has improved a little. Patient is now at home.

## 2022-08-27 NOTE — Telephone Encounter (Signed)
Patient given virtual, my chart visit for 08/31/2022 per patient request. While booking appointment patient was very irritated and times raising her  during our conversation. Patient voiced she just wanted her appointment changed from in person to virtual visit. Patient sounds emotional unstable and was asked if she felt like she was going to harm herself or others. Patient denies. Patient given national Suicide # [id number]. Patient voiced again while raising her voice that she was fine. Advised patient that if she needed anything further to please call us and if her anxiety, irritably or any of the emotions that she is feeling worsens call 911 or go to ED or Urgent Crisis center immediately.

## 2022-08-27 NOTE — Telephone Encounter (Signed)
Unable to connect messages (previous message from [place], Nurse Blanch Media)   Attempted to call patient to schedule a virtual visit as requested. No answer, left voicemail for patient to return call.

## 2022-08-27 NOTE — Telephone Encounter (Signed)
Chief Complaint: anxiety worsening , medications not effective  lexapro, trulicity  Symptoms: anxiety, irritable, denies harming self but states" what's the point of living". Reports uncontrollable eating esp. At night when home from work. Trouble sleeping through the night with "thoughts of seeing things". Only thing to make better is to eat. Trouble concentrating at work , can not focus Frequency: 1 month and worsening since taking medication  Pertinent Negatives: Patient denies chest pain no difficulty breathing denies harming self or others but make comments of being born just to die Disposition: [] ED /[x] Urgent Care (no appt availability in office) / [] Appointment(In office/virtual)/ []  [place] [facility]/ [] Home Care/ [] Refused Recommended Disposition /[] [place] [facility]/ []  Follow-up with PCP Additional Notes:   Recommended urgent crisis center. If sx worsen go to ED. Patient is at work and did not report if she would go to ED or urgent crisis center. Pt requesting if appt 09/03/22 can be VV due to her work schedule and can not get off of work. Please advise if medication change needed or can get VV sooner. Gave patient national suicide "54" text #.   Reason for Disposition  Patient sounds very upset or troubled to the triager  Answer Assessment - Initial Assessment Questions 1. CONCERN: "Did anything happen that prompted you to call today?"      Feeling overwhelmed, medication not working and wants to eat all of the time 2. ANXIETY SYMPTOMS: "Can you describe how you (your loved one; patient) have been feeling?" (e.g., tense, restless, panicky, anxious, keyed up, overwhelmed, sense of impending doom).      Restless, panicky, anxious, overwhelmed,  3. ONSET: "How long have you been feeling this way?" (e.g., hours, days, weeks)     Worse since taking medication x 1 month  4. SEVERITY: "How would you rate the level of anxiety?" (e.g., 0 - 10; or mild, moderate, severe).      High level 5. FUNCTIONAL IMPAIRMENT: "How have these feelings affected your ability to do daily activities?" "Have you had more difficulty than usual doing your normal daily activities?" (e.g., getting better, same, worse; self-care, school, work, interactions)     Anxiety affecting work , can no concentrate at work  6. HISTORY: "Have you felt this way before?" "Have you ever been diagnosed with an anxiety problem in the past?" (e.g., generalized anxiety disorder, panic attacks, PTSD). If Yes, ask: "How was this problem treated?" (e.g., medicines, counseling, etc.)     Yes  7. RISK OF HARM - SUICIDAL IDEATION: "Do you ever have thoughts of hurting or killing yourself?" If Yes, ask:  "Do you have these feelings now?" "Do you have a plan on how you would do this?"     Denies , but reports "what's the point of living", "all we are Jamie Spates do is be born to die" 8. TREATMENT:  "What has been done so far to treat this anxiety?" (e.g., medicines, relaxation strategies). "What has helped?"     Lexapro, and trulicity for weight loss 9. TREATMENT - THERAPIST: "Do you have a counselor or therapist? Name?"     No  10. POTENTIAL TRIGGERS: "Do you drink caffeinated beverages (e.g., coffee, colas, teas), and how much daily?" "Do you drink alcohol or use any drugs?" "Have you started any new medicines recently?"       na 11. PATIENT SUPPORT: "Who is with you now?" "Who do you live with?" "Do you have family or friends who you can talk to?"  Have friends and family to talk to, at work now  84. OTHER SYMPTOMS: "Do you have any other symptoms?" (e.g., feeling depressed, trouble concentrating, trouble sleeping, trouble breathing, palpitations or fast heartbeat, chest pain, sweating, nausea, or diarrhea)       Trouble concentrating, trouble sleeping, anxious, irritable frustrated 13. PREGNANCY: "Is there any chance you are pregnant?" "When was your last menstrual period?"       na  Protocols used: Anxiety and  Panic Attack-A-AH

## 2022-08-31 ENCOUNTER — Encounter: Payer: Self-pay | Admitting: Internal Medicine

## 2022-08-31 ENCOUNTER — Telehealth (HOSPITAL_COMMUNITY): Payer: Self-pay | Admitting: Licensed Clinical Social Worker

## 2022-08-31 ENCOUNTER — Telehealth (HOSPITAL_BASED_OUTPATIENT_CLINIC_OR_DEPARTMENT_OTHER): Payer: 59 | Admitting: Internal Medicine

## 2022-08-31 DIAGNOSIS — F331 Major depressive disorder, recurrent, moderate: Secondary | ICD-10-CM

## 2022-08-31 DIAGNOSIS — E1165 Type 2 diabetes mellitus with hyperglycemia: Secondary | ICD-10-CM

## 2022-08-31 DIAGNOSIS — F411 Generalized anxiety disorder: Secondary | ICD-10-CM | POA: Diagnosis not present

## 2022-08-31 MED ORDER — BUSPIRONE HCL 5 MG PO TABS: 5.0000 mg | ORAL_TABLET | Freq: Three times a day (TID) | ORAL | 2 refills | Status: DC

## 2022-08-31 NOTE — Progress Notes (Signed)
Virtual Visit via Video Note  I connected with Jamie Lee on 08/31/2022 at 8:14 AM by a video enabled telemedicine application and verified that I am speaking with the correct person using two identifiers.  Location: Patient: home Provider: Office   I discussed the limitations of evaluation and management by telemedicine and the availability of in person appointments. The patient expressed understanding and agreed to proceed.  History of Present Illness: Patient with history of DM type II, anxiety, MDD.    Patient requested appointment today to discuss anxiety/depression.  Patient states "I think the medication is working but  I am not working.  The anxiety, my nerves, its like everything."  She feels she is not functioning well at work.  Has difficulty getting her thoughts together.  "I am functioning because I have to."  When I ask whether she has any thoughts of harming herself, patient stated that everybody thinks about that sometimes but she has no plans to hurt herself.  She counseled her last appointment with her therapist because she felt nothing is working and talking about it is not working.  However she states that she intends to reschedule the appointment.  DM: She stopped checking blood sugars 3 weeks ago.  At that time blood sugars were running in the high 100s to mid 200s.  She had seen the clinical pharmacist back in November and was started on Trulicity.  However she waited about 3 weeks before starting the medication.  She has her son give her the injections.  Reports compliance with the Trulicity and Amaryl.  Her last A1c had improved from 12 to 9.6 in Nov 2023  Outpatient Encounter Medications as of 08/31/2022  Medication Sig   Accu-Chek Softclix Lancets lancets Use as instructed   Blood Glucose Monitoring Suppl (ACCU-CHEK GUIDE) w/Device KIT UAD   diclofenac sodium (VOLTAREN) 1 % GEL Apply 2 g topically 4 (four) times daily.   Dulaglutide (TRULICITY) 2.68 TM/1.9QQ SOPN  Inject 0.75 mg into the skin once a week.   escitalopram (LEXAPRO) 20 MG tablet TAKE 1 TABLET(20 MG) BY MOUTH DAILY   glimepiride (AMARYL) 4 MG tablet Take 1 tablet (4 mg total) by mouth daily before breakfast.   glucose blood (ACCU-CHEK GUIDE) test strip Use as instructed   JUNEL FE 1.5/30 1.5-30 MG-MCG tablet Take 1 tablet by mouth daily.   No facility-administered encounter medications on file as of 08/31/2022.      Observations/Objective: Patient is young to middle-age appearing African-American female who is very tearful. Has difficulty verbalizing her feelings/thoughts  Assessment and Plan: 1. Major depressive disorder, recurrent episode, moderate ([place]) 2. GAD (generalized anxiety disorder) -Will have her continue Lexapro. We discussed adding BuSpar to help control the anxiety a little bit more. At this point I think we need to get her in with a behavioral health provider for medication management.  She is agreeable to this.  She is also agreeable to rescheduling with her counselor. Advised that if she develops suicidal thoughts or just feels that she needs to be seen, she can go to behavioral health ER through [facility] behavioral health. Message sent to her counselor  3. Type 2 diabetes mellitus with hyperglycemia, without long-term current use of insulin ([place]) Patient to continue Trulicity and Amaryl.  Will have her follow-up with [place] in about 4 to 6 weeks for A1c check.  Encourage compliance with medication.  Encouraged her to check blood sugars and bring readings with her on that visit.   Follow Up  Instructions: 4-6 wks   I discussed the assessment and treatment plan with the patient. The patient was provided an opportunity to ask questions and all were answered. The patient agreed with the plan and demonstrated an understanding of the instructions.   The patient was advised to call back or seek an in-person evaluation if the symptoms worsen or if the condition fails to  improve as anticipated.  I spent 16 minutes dedicated to the care of this patient on the date of this encounter to include previsit review of messages and my last 2 notes, face-to-face time with the patient discussing diagnosis and management, post visit placement of orders and messaging her counselor  This note has been created with Dragon speech recognition software and Engineer, materials. Any transcriptional errors are unintentional.  Karle Plumber, MD

## 2022-08-31 NOTE — Telephone Encounter (Signed)
-----   Message from [place], [place] sent at 08/31/2022  5:11 PM EST ----- Our administrative team were unable to reach her today--I have requested that they document all communication attempts in EPIC to keep you in the loop.  I will also keep checking on the progress daily and will send you the information once she schedules.  ----- Message ----- From: Ladell Pier, MD Sent: 08/31/2022   8:44 AM EST To: Rachel Bo Hussami, LCSW  I am the PCP for this patient.  I saw her today via video visit.  Mentally she is in a bad place.  I would like to get her in with a psychiatrist to assist with medication management.  Can you assist me with getting her an appointment?  She states that she plans to reschedule with you as well.

## 2022-09-02 ENCOUNTER — Telehealth: Payer: Self-pay | Admitting: Internal Medicine

## 2022-09-02 NOTE — Telephone Encounter (Signed)
-----   Message from [place], [place] sent at 09/01/2022 11:00 AM EST ----- Hello--I just received word that she is scheduled this Saturday at 11am with Dr. Nelida Gores.  ----- Message ----- From: Ladell Pier, MD Sent: 08/31/2022  12:26 PM EST To: Rachel Bo Hussami, LCSW  Can you let me know when appt is scheduled for and with which provider?  Thanks so much for your early reply. ----- Message ----- From: Farrel Conners Sent: 08/31/2022   9:05 AM EST To: Ladell Pier, MD  Yes I will submit a referral today.  Thank you so much for reaching out!  Christina  ----- Message ----- From: Ladell Pier, MD Sent: 08/31/2022   8:44 AM EST To: Rachel Bo Hussami, LCSW  I am the PCP for this patient.  I saw her today via video visit.  Mentally she is in a bad place.  I would like to get her in with a psychiatrist to assist with medication management.  Can you assist me with getting her an appointment?  She states that she plans to reschedule with you as well.

## 2022-09-03 ENCOUNTER — Ambulatory Visit: Payer: Self-pay | Admitting: Internal Medicine

## 2022-09-03 ENCOUNTER — Ambulatory Visit: Payer: Self-pay | Admitting: Pharmacist

## 2022-09-05 ENCOUNTER — Encounter (HOSPITAL_COMMUNITY): Payer: 59 | Admitting: Psychiatry

## 2022-09-05 ENCOUNTER — Encounter (HOSPITAL_COMMUNITY): Payer: Self-pay

## 2022-09-06 ENCOUNTER — Other Ambulatory Visit: Payer: Self-pay | Admitting: Internal Medicine

## 2022-09-06 DIAGNOSIS — E1165 Type 2 diabetes mellitus with hyperglycemia: Secondary | ICD-10-CM

## 2022-09-07 NOTE — Progress Notes (Signed)
This encounter was created in error - please disregard.

## 2022-09-07 NOTE — Telephone Encounter (Signed)
Requested medication (s) are due for refill today: yes  Requested medication (s) are on the active medication list: yes  Last refill:  06/02/22 6 ml   Future visit scheduled: yes  Notes to clinic:  last A1C out of range- has upcoming appt and f/u labs   Requested Prescriptions  Pending Prescriptions Disp Refills   TRULICITY [id number] SOPN [Pharmacy Med Name: TRULICITY [id number] SDP 0.5ML] 6 mL 0    Sig: ADMINISTER 0.75 MG UNDER THE SKIN 1 TIME A WEEK     Endocrinology:  Diabetes - GLP-1 Receptor Agonists Failed - 09/06/2022  5:42 AM      Failed - HBA1C is between 0 and 7.9 and within 180 days    HbA1c, POC (controlled diabetic range)  Date Value Ref Range Status  06/02/2022 9.6 (A) 0.0 - 7.0 % Final         Passed - Valid encounter within last 6 months    Recent Outpatient Visits           1 week ago Major depressive disorder, recurrent episode, moderate ([place])   [place] Karle Plumber B, MD   3 months ago Type 2 diabetes mellitus with hyperglycemia, without long-term current use of insulin [facility])   [place], [place] L, RPH-CPP   4 months ago Type 2 diabetes mellitus with hyperglycemia, without long-term current use of insulin [facility])   [place] Ladell Pier, MD   7 months ago Encounter to establish care   [place] Ladell Pier, MD   4 years ago Encounter to establish care   [facility], Carroll Sage, NP       Future Appointments             In 1 month Wynetta Emery, Dalbert Batman, MD [place]

## 2022-09-09 ENCOUNTER — Telehealth: Payer: Self-pay | Admitting: Internal Medicine

## 2022-09-09 NOTE — Telephone Encounter (Signed)
Phone call received from NP Lupita Raider from [facility] this a.m. regarding patient.  She states that patient was seen for persistent cough, chest pain and dizziness in setting of post viral illness.  She was more concerned about patient's mental health and wanted to let me know that patient had stopped her psychiatric medications because she feels they are not working well and thinks they may be blocking the effect of antibiotics that she was given.  She notes that patient denies any HI/SI but has flat affect and looks tired.  NP did read my note from my virtual visit with patient 08/31/2022.  I told her that I subsequently did reach out to the patient's counselor to get her back in and also to get her established with a psychiatrist for medication management.  Appointment was set up with a psychiatrist for 09/05/2022 but looks patient no showed the appointment.  I advised that patient be seen in behavioral health ER if she feels she is in crisis.  There is not a whole lot more I can do if patient does not follow through with seeing her counselor and the psychiatrist.  I will send a message to her counselor as well.

## 2022-09-24 ENCOUNTER — Ambulatory Visit (INDEPENDENT_AMBULATORY_CARE_PROVIDER_SITE_OTHER): Payer: 59 | Admitting: Licensed Clinical Social Worker

## 2022-09-24 DIAGNOSIS — Z91199 Patient's noncompliance with other medical treatment and regimen due to unspecified reason: Secondary | ICD-10-CM

## 2022-09-24 NOTE — Progress Notes (Signed)
LCSW counselor attempted to connect with patient for scheduled appointment via MyChart video text request x 2 and email request x 2 with no response.   Attempt 1: Text and email: 8:02a  Attempt 2: Text and email: 8:09a  Video chat session was left open until 8:15a. Pt did not show to the video chat session, so session closed out at 8:17a  Per [place], after multiple attempts to reach pt unsuccessfully at appointed time--visit will be coded as no show

## 2022-10-13 ENCOUNTER — Ambulatory Visit: Payer: 59 | Attending: Internal Medicine | Admitting: Internal Medicine

## 2022-10-13 ENCOUNTER — Encounter: Payer: Self-pay | Admitting: Internal Medicine

## 2022-10-13 VITALS — BP 112/80 | HR 75 | Temp 98.4°F | Ht 67.0 in | Wt 267.0 lb

## 2022-10-13 DIAGNOSIS — F411 Generalized anxiety disorder: Secondary | ICD-10-CM | POA: Diagnosis not present

## 2022-10-13 DIAGNOSIS — E1169 Type 2 diabetes mellitus with other specified complication: Secondary | ICD-10-CM

## 2022-10-13 DIAGNOSIS — E1165 Type 2 diabetes mellitus with hyperglycemia: Secondary | ICD-10-CM

## 2022-10-13 DIAGNOSIS — Z5320 Procedure and treatment not carried out because of patient's decision for unspecified reasons: Secondary | ICD-10-CM

## 2022-10-13 DIAGNOSIS — F331 Major depressive disorder, recurrent, moderate: Secondary | ICD-10-CM

## 2022-10-13 DIAGNOSIS — Z2821 Immunization not carried out because of patient refusal: Secondary | ICD-10-CM

## 2022-10-13 DIAGNOSIS — Z6841 Body Mass Index (BMI) 40.0 and over, adult: Secondary | ICD-10-CM

## 2022-10-13 LAB — POCT GLYCOSYLATED HEMOGLOBIN (HGB A1C): HbA1c, POC (controlled diabetic range): 11 % — AB (ref 0.0–7.0)

## 2022-10-13 LAB — GLUCOSE, POCT (MANUAL RESULT ENTRY): POC Glucose: 312 mg/dl — AB (ref 70–99)

## 2022-10-13 MED ORDER — TRULICITY 1.5 MG/0.5ML ~~LOC~~ SOAJ: 1.5000 mg | SUBCUTANEOUS | 4 refills | Status: DC

## 2022-10-13 NOTE — Progress Notes (Signed)
Patient ID: Jamie Lee, female    DOB: Sep 08, 1980  MRN: [id number]  CC: Diabetes (DM f/u. Med refills. Interested in [place] sharp back pains. /No to flu vax. Yes to pap smear for another visit.)   Subjective: Jamie Lee is a 42 y.o. female who presents for f/u DM and dep/anx Her concerns today include:  Patient with history of DM type II, anxiety, MDD.    MDD/GAD: On last visit 6 weeks ago, patient reported significant issues with anxiety and depression..  Advised to continue Lexapro.  We added BuSpar.  I encouraged her to get back in with her behavioral health provider for counseling and also for medication management.  I sent her counselor a message.  She has tried reaching out to the patient several times but patient has not replied. Today she tells me she has not called back because she does not feel anything they were doing helps.  She has been using herbs, listening to music and doing meditation.  Started taking the BuSpar about a week ago and thinks it helps.  Not taking Lexapro.  Was on antibiotic for a week or so for respiratory infection and thought that she should not take the Lexapro with the antibiotic.  She tells me that she does not know when she became not mentally okay and why it is so hard for her to control it.  DM: Results for orders placed or performed in visit on 10/13/22  POCT glucose (manual entry)  Result Value Ref Range   POC Glucose 312 (A) 70 - 99 mg/dl  POCT glycosylated hemoglobin (Hb A1C)  Result Value Ref Range   Hemoglobin A1C     HbA1c POC (<> result, manual entry)     HbA1c, POC (prediabetic range)     HbA1c, POC (controlled diabetic range) 11.0 (A) 0.0 - 7.0 %  Last A1C was 9.6 Should be on Amaryl and Trulicity Reports compliance with taking Amaryl 4 mg and Trulicity. Eats more when anxiety is high.  "I eat more bad stuff than I do good stuff." Went to gym a few mths back but kept getting sick with respiratory illnesses so  she discontinued going.  HM: declines HIV/hep C screen and Tdapt Patient Active Problem List   Diagnosis Date Noted   Tetanus, diphtheria, and acellular pertussis (Tdap) vaccination declined 10/13/2022   Type 2 diabetes mellitus with morbid obesity ([place]) 01/22/2022   GAD (generalized anxiety disorder) 01/22/2022   Major depressive disorder, single episode, moderate ([place]) 01/22/2022     Current Outpatient Medications on File Prior to Visit  Medication Sig Dispense Refill   Accu-Chek Softclix Lancets lancets Use as instructed 100 each 12   Blood Glucose Monitoring Suppl (ACCU-CHEK GUIDE) w/Device KIT UAD 1 kit 0   busPIRone (BUSPAR) 5 MG tablet Take 1 tablet (5 mg total) by mouth 3 (three) times daily. 60 tablet 2   diclofenac sodium (VOLTAREN) 1 % GEL Apply 2 g topically 4 (four) times daily. 100 g 1   escitalopram (LEXAPRO) 20 MG tablet TAKE 1 TABLET(20 MG) BY MOUTH DAILY 90 tablet 0   glimepiride (AMARYL) 4 MG tablet Take 1 tablet (4 mg total) by mouth daily before breakfast. 30 tablet 3   glucose blood (ACCU-CHEK GUIDE) test strip Use as instructed 100 each 12   JUNEL FE 1.5/30 1.5-30 MG-MCG tablet Take 1 tablet by mouth daily.     No current facility-administered medications on file prior to visit.    Allergies  Allergen  Reactions   Metformin And Related Diarrhea    Social History   Socioeconomic History   Marital status: Married    Spouse name: Not on file   Number of children: Not on file   Years of education: Not on file   Highest education level: Not on file  Occupational History   Not on file  Tobacco Use   Smoking status: Never   Smokeless tobacco: Never  Vaping Use   Vaping Use: Never used  Substance and Sexual Activity   Alcohol use: Yes    Comment: occ   Drug use: No   Sexual activity: Not on file  Other Topics Concern   Not on file  Social History Narrative   Not on file   Social Determinants of Health   Financial Resource Strain: Not on file   Food Insecurity: Not on file  Transportation Needs: Not on file  Physical Activity: Not on file  Stress: Not on file  Social Connections: Not on file  Intimate Partner Violence: Not on file    Family History  Problem Relation Age of Onset   Diabetes Maternal Grandfather    Blindness Maternal Grandfather    Breast cancer Neg Hx     Past Surgical History:  Procedure Laterality Date   CHOLECYSTECTOMY     EYE SURGERY Bilateral     ROS: Review of Systems Negative except as stated above  PHYSICAL EXAM: BP 112/80 (BP Location: Left Arm, Patient Position: Sitting, Cuff Size: Normal)   Pulse 75   Temp 98.4 F (36.9 C) (Oral)   Ht 5\' 7"  (1.702 m)   Wt 267 lb (121.1 kg)   SpO2 97%   BMI 41.82 kg/m   Physical Exam  General appearance - alert, morbidly obese middle-age African-American female  in no distress.  Patient is noted to continuously nervously shake her legs while talking to me. Mental status -appears nervous and unsettled.  Talks in tangents. Chest - clear to auscultation, no wheezes, rales or rhonchi, symmetric air entry Heart - normal rate, regular rhythm, normal S1, S2, no murmurs, rubs, clicks or gallops Extremities - peripheral pulses normal, no pedal edema, no clubbing or cyanosis Diabetic Foot Exam - Simple   Simple Foot Form Diabetic Foot exam was performed with the following findings: Yes 10/13/2022 11:38 AM  Visual Inspection No deformities, no ulcerations, no other skin breakdown bilaterally: Yes Sensation Testing Intact to touch and monofilament testing bilaterally: Yes Pulse Check Posterior Tibialis and Dorsalis pulse intact bilaterally: Yes Comments        10/13/2022   10:48 AM 03/12/2022    4:51 PM 01/22/2022    2:38 PM  Depression screen PHQ 2/9  Decreased Interest 1 1 2   Down, Depressed, Hopeless 1 1 2   PHQ - 2 Score 2 2 4   Altered sleeping 1 1 3   Tired, decreased energy 1 1 3   Change in appetite 1 1 3   Feeling bad or failure about  yourself  1 2 2   Trouble concentrating 1 3 3   Moving slowly or fidgety/restless 1 3 3   Suicidal thoughts 2 0 0  PHQ-9 Score 10 13 21   Difficult doing work/chores  Somewhat difficult       10/13/2022   10:48 AM 03/12/2022    4:51 PM 01/22/2022    2:38 PM 07/01/2018   12:24 PM  GAD 7 : Generalized Anxiety Score  Nervous, Anxious, on Edge 1 3 3 3   Control/stop worrying 1 3 3 3   Worry too  much - different things 1 3 3 3   Trouble relaxing 1 0 3 3  Restless 1 3 3 2   Easily annoyed or irritable 1 3 3 3   Afraid - awful might happen 1 3 3 3   Total GAD 7 Score 7 18 21 20   Anxiety Difficulty  Somewhat difficult          Latest Ref Rng & Units 01/22/2022    3:57 PM 03/15/2021    7:00 PM 04/17/2016    8:56 PM  CMP  Glucose 70 - 99 mg/dL 329  142  105   BUN 6 - 24 mg/dL 12  10  12    Creatinine 0.57 - 1.00 mg/dL 0.78  0.75  0.74   Sodium 134 - 144 mmol/L 138  136  137   Potassium 3.5 - 5.2 mmol/L 4.5  3.8  3.6   Chloride 96 - 106 mmol/L 99  102  105   CO2 20 - 29 mmol/L 21  24  25    Calcium 8.7 - 10.2 mg/dL 10.3  10.6  9.5   Total Protein 6.0 - 8.5 g/dL 7.4   7.2   Total Bilirubin 0.0 - 1.2 mg/dL 0.4   0.8   Alkaline Phos 44 - 121 IU/L 70   57   AST 0 - 40 IU/L 19   21   ALT 0 - 32 IU/L 20   21    Lipid Panel     Component Value Date/Time   CHOL 181 01/22/2022 1557   TRIG 128 01/22/2022 1557   HDL 71 01/22/2022 1557   CHOLHDL 2.5 01/22/2022 1557   LDLCALC 88 01/22/2022 1557    CBC    Component Value Date/Time   WBC 5.6 01/22/2022 1557   WBC 5.5 03/15/2021 1900   RBC 4.79 01/22/2022 1557   RBC 4.19 03/15/2021 1900   HGB 14.8 01/22/2022 1557   HCT 44.6 01/22/2022 1557   PLT 172 01/22/2022 1557   MCV 93 01/22/2022 1557   MCH 30.9 01/22/2022 1557   MCH 31.3 03/15/2021 1900   MCHC 33.2 01/22/2022 1557   MCHC 33.2 03/15/2021 1900   RDW 12.3 01/22/2022 1557   LYMPHSABS 1.7 05/22/2010 1205   MONOABS 0.4 05/22/2010 1205   EOSABS 0.2 05/22/2010 1205   BASOSABS 0.0  05/22/2010 1205    ASSESSMENT AND PLAN:  1. Type 2 diabetes mellitus with hyperglycemia, without long-term current use of insulin ([place]) Discussed on encourage better diabetes control. Encourage healthy eating habits.  Encouraged her to try to move more. Recommend adding evening dose of long-acting insulin but patient declined.  We ultimately decided to increase Trulicity to 1.5 mg once a week.  Continue Amaryl 4 mg daily. - POCT glucose (manual entry) - POCT glycosylated hemoglobin (Hb A1C) - Ambulatory referral to Ophthalmology - Microalbumin / creatinine urine ratio - Amb ref to Medical Nutrition Therapy-MNT - Dulaglutide (TRULICITY) 1.5 0000000 SOPN; Inject 1.5 mg into the skin once a week.  Dispense: 2 mL; Refill: 4 - Basic Metabolic Panel  2. Morbid obesity ([place]) See #1 above - Amb ref to Medical Nutrition Therapy-MNT  3. Major depressive disorder, recurrent episode, moderate (HCC) 4. GAD (generalized anxiety disorder) Strongly encouraged her to get back in with behavioral health for counseling and medication management.  It is apparent to me that she is still not doing well mentally.  Encouraged her to take the Lexapro and BuSpar.  5. Tetanus, diphtheria, and acellular pertussis (Tdap) vaccination declined   6. HIV  screening declined 7. Screening for hepatitis C declined   Patient was given the opportunity to ask questions.  Patient verbalized understanding of the plan and was able to repeat key elements of the plan.   This documentation was completed using Radio producer.  Any transcriptional errors are unintentional.  Orders Placed This Encounter  Procedures   Microalbumin / creatinine urine ratio   Basic Metabolic Panel   Ambulatory referral to Ophthalmology   Amb ref to Medical Nutrition Therapy-MNT   POCT glucose (manual entry)   POCT glycosylated hemoglobin (Hb A1C)     Requested Prescriptions   Signed Prescriptions Disp Refills    Dulaglutide (TRULICITY) 1.5 0000000 SOPN 2 mL 4    Sig: Inject 1.5 mg into the skin once a week.    Return in about 3 months (around 01/13/2023).  Karle Plumber, MD, FACP

## 2022-10-13 NOTE — Patient Instructions (Signed)
We have referred you to a nutritionist. Increase Trulicity to 1.5 mg once a week.  If they do not have the Trulicity in stock, we will need to change you to [place] as was discussed today.  If we do have to change to Ozempic, we will start you on the 0.5 mg dose once a week.  Ozempic has some of the same side effects as Trulicity which can include nausea, vomiting, diarrhea, pain in the abdomen due to pancreatitis and bowel obstruction.  If you develop any vomiting, severe diarrhea, pain in the abdomen or unable to move your bowels on Trulicity or Ozempic, stop the medicine and call us.  I encourage you to follow-up with behavioral health.

## 2022-10-14 ENCOUNTER — Other Ambulatory Visit: Payer: Self-pay | Admitting: Internal Medicine

## 2022-10-14 DIAGNOSIS — E1165 Type 2 diabetes mellitus with hyperglycemia: Secondary | ICD-10-CM

## 2022-10-14 LAB — BASIC METABOLIC PANEL
BUN/Creatinine Ratio: 15 (ref 9–23)
BUN: 11 mg/dL (ref 6–24)
CO2: 21 mmol/L (ref 20–29)
Calcium: 10.1 mg/dL (ref 8.7–10.2)
Chloride: 99 mmol/L (ref 96–106)
Creatinine, Ser: 0.73 mg/dL (ref 0.57–1.00)
Glucose: 290 mg/dL — ABNORMAL HIGH (ref 70–99)
Potassium: 4.2 mmol/L (ref 3.5–5.2)
Sodium: 137 mmol/L (ref 134–144)
eGFR: 105 mL/min/{1.73_m2} (ref >59)

## 2022-10-14 LAB — MICROALBUMIN / CREATININE URINE RATIO
Creatinine, Urine: 112.5 mg/dL
Microalb/Creat Ratio: 28 mg/g creat (ref 0–29)
Microalbumin, Urine: 31 ug/mL

## 2022-10-14 MED ORDER — FREESTYLE LIBRE 3 READER DEVI: 1.0000 | Freq: Every day | 0 refills | Status: AC

## 2022-10-14 MED ORDER — FREESTYLE LIBRE 3 SENSOR MISC: 6 refills | Status: DC

## 2022-10-16 ENCOUNTER — Other Ambulatory Visit: Payer: Self-pay | Admitting: Internal Medicine

## 2022-10-16 DIAGNOSIS — F321 Major depressive disorder, single episode, moderate: Secondary | ICD-10-CM

## 2022-10-16 DIAGNOSIS — F411 Generalized anxiety disorder: Secondary | ICD-10-CM

## 2022-11-25 ENCOUNTER — Encounter: Payer: Self-pay | Admitting: Registered"

## 2022-11-25 ENCOUNTER — Encounter: Payer: 59 | Attending: Internal Medicine | Admitting: Registered"

## 2022-11-25 NOTE — Patient Instructions (Addendum)
-   Aim to have half a plate of non-starch vegetables with lunch and dinner.   - Aim for 3 meals a day.

## 2022-11-25 NOTE — Progress Notes (Unsigned)
8:01 - 9:01  States she stopped taking her anxiety medication on 4/24. States she started drinking mushroom coffee but has not discussed with provider about not taking the medication.  States she didn't take diabetes medication this morning. States she has turned off the notifications to let her know when her blood sugar is high because its beeping throughout the day and beeps really loud. States her blood sugar numbers are more in the 300s even after not eating throughout the day. Reports she only experiences lows when she doesn't eat. States when she was initially diagnosed, she made changes and reduced A1c from 9% to 7%. States she didn't take medication during that time and made lifestyle changes.   States she woke up this morning, angry, and should have jumped on her elliptical to calm her mood but didn't.   States she has challenges with figuring out what to eat. Reports she feels hunger but doesn't want to eat sometimes. States she will eat things until she figures out what she really wants to eat.   Pt expectations: wants to know what to eat

## 2023-01-19 ENCOUNTER — Ambulatory Visit: Payer: 59 | Admitting: Registered"

## 2023-01-22 ENCOUNTER — Encounter: Payer: Self-pay | Admitting: Internal Medicine

## 2023-01-22 ENCOUNTER — Ambulatory Visit: Payer: 59 | Attending: Internal Medicine | Admitting: Internal Medicine

## 2023-01-22 DIAGNOSIS — F411 Generalized anxiety disorder: Secondary | ICD-10-CM

## 2023-01-22 DIAGNOSIS — Z7985 Long-term (current) use of injectable non-insulin antidiabetic drugs: Secondary | ICD-10-CM

## 2023-01-22 DIAGNOSIS — F321 Major depressive disorder, single episode, moderate: Secondary | ICD-10-CM | POA: Diagnosis not present

## 2023-01-22 DIAGNOSIS — E1169 Type 2 diabetes mellitus with other specified complication: Secondary | ICD-10-CM | POA: Diagnosis not present

## 2023-01-22 DIAGNOSIS — E1165 Type 2 diabetes mellitus with hyperglycemia: Secondary | ICD-10-CM

## 2023-01-22 MED ORDER — ESCITALOPRAM OXALATE 20 MG PO TABS: ORAL_TABLET | ORAL | 1 refills | Status: DC

## 2023-01-22 MED ORDER — BUSPIRONE HCL 7.5 MG PO TABS: 7.5000 mg | ORAL_TABLET | Freq: Three times a day (TID) | ORAL | 4 refills | Status: DC

## 2023-01-22 MED ORDER — GLIMEPIRIDE 4 MG PO TABS: 4.0000 mg | ORAL_TABLET | Freq: Two times a day (BID) | ORAL | 1 refills | Status: DC

## 2023-01-22 NOTE — Progress Notes (Signed)
Patient ID: Jamie Lee, female   DOB: 05-13-1981, 42 y.o.   MRN: [id number] Virtual Visit via Video Note  I connected with Jamie Lee on 01/22/2023 at 8:57 AM by a video enabled telemedicine application and verified that I am speaking with the correct person using two identifiers.  Location: Patient: home Provider: Office   I discussed the limitations of evaluation and management by telemedicine and the availability of in person appointments. The patient expressed understanding and agreed to proceed.  History of Present Illness: Patient with history of DM type II, anxiety, MDD.    MDD/GAD: On last visit with me patient was still having significant issues with depression and anxiety.  I encouraged her to take the Lexapro and BuSpar consistently and to get back in with behavioral health for counseling and medication management. -since then she started taking both meds consistently about 1 mth. Reports MDD/GAD much better.  Feels partial control on meds.  Would like increase dose of Buspar -did not get back in with her counseling/BH.  "I started thinking about that last wk."   -works from home.  Sometimes she has problems focusing; feels higher does of Buspar would help with this -No SI  DM/morbid obesity:  Lab Results  Component Value Date   HGBA1C 11.0 (A) 10/13/2022  On last visit she admitted to poor eating habits.  A1c was elevated.  We increased Trulicity to 1.5 mg and had her continue Amaryl 4 mg daily. Has Libre device which she loves BS this a.m was just below 250 before BF.  Reports range for blood sugars 70-180 11% of the times, blood sugars 181-250 66% of the times in past wk;  for past 2 wks, BS 70-180 15% of times and 181-250 67% of times.  Estimated A1C for past 2 wks 8.5 -had to wait a while to get the increase dose of Trulicity of was still taking the 0.75 mg until 3 wks.  No N/V; has decreased her appetite.  -feels she hs lost inches; "I don't really get on the  scale." Sedentary working from home but does cut her own grass.  Has an elliptical but not using. Endorses getting wgh down is important to her.  -referred for eye exam last visit. Her last eye doc no longer takes her insurance.  Referral sent to Jamie Lee  Current Outpatient Medications  Medication Instructions   Accu-Chek Softclix Lancets lancets Use as instructed   Blood Glucose Monitoring Suppl (ACCU-CHEK GUIDE) w/Device KIT UAD   busPIRone (BUSPAR) 5 mg, Oral, 3 times daily   Continuous Blood Gluc Receiver (FREESTYLE LIBRE 3 READER) DEVI 1 Device, Does not apply, Daily, ICD E11.69, E66.01   Continuous Blood Gluc Sensor (FREESTYLE LIBRE 3 SENSOR) MISC Change Q 2 weeks ICD E11.69, E66.01   diclofenac sodium (VOLTAREN) 2 g, Topical, 4 times daily   escitalopram (LEXAPRO) 20 MG tablet TAKE 1 TABLET(20 MG) BY MOUTH DAILY   glimepiride (AMARYL) 4 MG tablet TAKE 1 TABLET(4 MG) BY MOUTH DAILY BEFORE BREAKFAST   glucose blood (ACCU-CHEK GUIDE) test strip Use as instructed   JUNEL FE 1.5/30 1.5-30 MG-MCG tablet 1 tablet, Oral, Daily   Trulicity 1.5 mg, Subcutaneous, Weekly      Observations/Objective: Middle-age African-American female in NAD   Chemistry      Component Value Date/Time   NA 137 10/13/2022 1156   K 4.2 10/13/2022 1156   CL 99 10/13/2022 1156   CO2 21 10/13/2022 1156   BUN 11 10/13/2022 1156  CREATININE 0.73 10/13/2022 1156      Component Value Date/Time   CALCIUM 10.1 10/13/2022 1156   ALKPHOS 70 01/22/2022 1557   AST 19 01/22/2022 1557   ALT 20 01/22/2022 1557   BILITOT 0.4 01/22/2022 1557     Lab Results  Component Value Date   CHOL 181 01/22/2022   HDL 71 01/22/2022   LDLCALC 88 01/22/2022   TRIG 128 01/22/2022   CHOLHDL 2.5 01/22/2022     Assessment and Plan: 1. Type 2 diabetes mellitus with morbid obesity (HCC) I am pleased that her compliance with medications have increased and that she is using the continuous glucose monitor.  Encourage healthy  eating habits. He is not at goal.  We discussed adding insulin versus increasing the dose of glimepiride to 4 mg twice a day.  She prefers increasing the oral medication.  She will continue Trulicity at 1.5 mg once a week. Encouraged her to use her elliptical to increase physical activity.  She promises to do so. -Given phone number for [facility] so that she can call and schedule her appointment. -Discussed importance of all diabetics being placed on statin therapy to help reduce cardiovascular events.  She is agreeable to starting low-dose of atorvastatin.  We will check LFTs first and if normal, will start Lipitor. - Lipid panel; Future - Comprehensive metabolic panel; Future - CBC; Future - Hemoglobin A1c; Future - glimepiride (AMARYL) 4 MG tablet; Take 1 tablet (4 mg total) by mouth 2 (two) times daily.  Dispense: 180 tablet; Refill: 1  2. Long-term (current) use of injectable non-insulin antidiabetic drugs See #1 above  3. GAD (generalized anxiety disorder) Approved but patient feels she would benefit from a higher dose of the BuSpar.  Increase BuSpar to 7.5 mg 3 times a day. - busPIRone (BUSPAR) 7.5 MG tablet; Take 1 tablet (7.5 mg total) by mouth 3 (three) times daily.  Dispense: 90 tablet; Refill: 4 - escitalopram (LEXAPRO) 20 MG tablet; TAKE 1 TABLET(20 MG) BY MOUTH DAILY  Dispense: 90 tablet; Refill: 1  4. Major depressive disorder, single episode, moderate (HCC) Improved.  Continue Lexapro 20 mg daily - escitalopram (LEXAPRO) 20 MG tablet; TAKE 1 TABLET(20 MG) BY MOUTH DAILY  Dispense: 90 tablet; Refill: 1    Follow Up Instructions: 4 mths   I discussed the assessment and treatment plan with the patient. The patient was provided an opportunity to ask questions and all were answered. The patient agreed with the plan and demonstrated an understanding of the instructions.   The patient was advised to call back or seek an in-person evaluation if the symptoms  worsen or if the condition fails to improve as anticipated.  I spent 30 minutes dedicated to the care of this patient on the date of this encounter to include previsit review of my last 2 notes, face-to-face time with patient discussing diagnosis and management, and postvisit entering of orders.  This note has been created with Education officer, environmental. Any transcriptional errors are unintentional.  Jonah Blue, MD

## 2023-02-03 ENCOUNTER — Ambulatory Visit: Payer: 59 | Attending: Internal Medicine

## 2023-02-04 LAB — CBC
Hematocrit: 41.9 % (ref 34.0–46.6)
Hemoglobin: 13.8 g/dL (ref 11.1–15.9)
MCH: 31 pg (ref 26.6–33.0)
MCHC: 32.9 g/dL (ref 31.5–35.7)
MCV: 94 fL (ref 79–97)
Platelets: 163 10*3/uL (ref 150–450)
RBC: 4.45 x10E6/uL (ref 3.77–5.28)
RDW: 12.2 % (ref 11.7–15.4)
WBC: 4.9 10*3/uL (ref 3.4–10.8)

## 2023-02-04 LAB — COMPREHENSIVE METABOLIC PANEL
ALT: 14 IU/L (ref 0–32)
AST: 16 IU/L (ref 0–40)
Albumin: 4.4 g/dL (ref 3.9–4.9)
Alkaline Phosphatase: 67 IU/L (ref 44–121)
BUN/Creatinine Ratio: 15 (ref 9–23)
BUN: 11 mg/dL (ref 6–24)
Bilirubin Total: 0.4 mg/dL (ref 0.0–1.2)
CO2: 20 mmol/L (ref 20–29)
Calcium: 10 mg/dL (ref 8.7–10.2)
Chloride: 102 mmol/L (ref 96–106)
Creatinine, Ser: 0.72 mg/dL (ref 0.57–1.00)
Globulin, Total: 2.3 g/dL (ref 1.5–4.5)
Glucose: 179 mg/dL — ABNORMAL HIGH (ref 70–99)
Potassium: 4.5 mmol/L (ref 3.5–5.2)
Sodium: 137 mmol/L (ref 134–144)
Total Protein: 6.7 g/dL (ref 6.0–8.5)
eGFR: 107 mL/min/{1.73_m2} (ref >59)

## 2023-02-04 LAB — HEMOGLOBIN A1C
Est. average glucose Bld gHb Est-mCnc: 220 mg/dL
Hgb A1c MFr Bld: 9.3 % — ABNORMAL HIGH (ref 4.8–5.6)

## 2023-02-04 LAB — LIPID PANEL
Chol/HDL Ratio: 2.8 ratio (ref 0.0–4.4)
Cholesterol, Total: 160 mg/dL (ref 100–199)
HDL: 58 mg/dL (ref >39)
LDL Chol Calc (NIH): 87 mg/dL (ref 0–99)
Triglycerides: 77 mg/dL (ref 0–149)
VLDL Cholesterol Cal: 15 mg/dL (ref 5–40)

## 2023-02-10 ENCOUNTER — Ambulatory Visit (HOSPITAL_COMMUNITY)
Admission: EM | Admit: 2023-02-10 | Discharge: 2023-02-10 | Disposition: A | Discharge status: Home / Self Care | Payer: 59 | Attending: Family Medicine | Admitting: Family Medicine

## 2023-02-10 ENCOUNTER — Encounter (HOSPITAL_COMMUNITY): Payer: Self-pay | Admitting: *Deleted

## 2023-02-10 DIAGNOSIS — E1165 Type 2 diabetes mellitus with hyperglycemia: Secondary | ICD-10-CM | POA: Diagnosis not present

## 2023-02-10 DIAGNOSIS — R300 Dysuria: Secondary | ICD-10-CM | POA: Diagnosis present

## 2023-02-10 DIAGNOSIS — R350 Frequency of micturition: Secondary | ICD-10-CM | POA: Diagnosis not present

## 2023-02-10 LAB — POCT URINALYSIS DIP (MANUAL ENTRY)
Bilirubin, UA: NEGATIVE
Blood, UA: NEGATIVE
Glucose, UA: 100 mg/dL — AB
Ketones, POC UA: NEGATIVE mg/dL
Leukocytes, UA: NEGATIVE
Nitrite, UA: NEGATIVE
Protein Ur, POC: NEGATIVE mg/dL
Spec Grav, UA: >=1.03 — AB (ref 1.010–1.025)
Urobilinogen, UA: 0.2 E.U./dL
pH, UA: 5 (ref 5.0–8.0)

## 2023-02-10 LAB — POCT FASTING CBG KUC MANUAL ENTRY: POCT Glucose (KUC): 184 mg/dL — AB (ref 70–99)

## 2023-02-10 NOTE — ED Triage Notes (Signed)
Pt states she has had dysuria and urine frequency X 1 months. She has been having sx for over a month. She has been taking AZO OTC but ran out so she came in today.

## 2023-02-10 NOTE — Discharge Instructions (Signed)
We have sent testing for vaginal infections. We will notify you of any positive results once they are received. If required, we will prescribe any medications you might need.  Please refrain from all sexual activity for at least the next seven days.  

## 2023-02-11 ENCOUNTER — Other Ambulatory Visit: Payer: Self-pay | Admitting: Internal Medicine

## 2023-02-11 ENCOUNTER — Telehealth (HOSPITAL_COMMUNITY): Payer: Self-pay | Admitting: Emergency Medicine

## 2023-02-11 DIAGNOSIS — Z1231 Encounter for screening mammogram for malignant neoplasm of breast: Secondary | ICD-10-CM

## 2023-02-11 LAB — CERVICOVAGINAL ANCILLARY ONLY
Bacterial Vaginitis (gardnerella): NEGATIVE
Candida Glabrata: POSITIVE — AB
Candida Vaginitis: NEGATIVE
Chlamydia: NEGATIVE
Comment: NEGATIVE
Comment: NEGATIVE
Comment: NEGATIVE
Comment: NEGATIVE
Comment: NEGATIVE
Comment: NORMAL
Neisseria Gonorrhea: NEGATIVE
Trichomonas: NEGATIVE

## 2023-02-11 MED ORDER — FLUCONAZOLE 150 MG PO TABS: 150.0000 mg | ORAL_TABLET | Freq: Once | ORAL | 0 refills | Status: AC

## 2023-02-11 NOTE — ED Provider Notes (Signed)
[facility]   [id number] 02/10/23 Arrival Time: 1805  ASSESSMENT & PLAN:  1. Dysuria   2. Urinary frequency   3. Type 2 diabetes mellitus with hyperglycemia, without long-term current use of insulin (HCC)    Vaginal cytology pending. U/A without signs of infection.    Discharge Instructions      We have sent testing for vaginal infections. We will notify you of any positive results once they are received. If required, we will prescribe any medications you might need.  Please refrain from all sexual activity for at least the next seven days.     Without s/s of PID.  Labs Reviewed  POCT URINALYSIS DIP (MANUAL ENTRY) - Abnormal; Notable for the following components:      Result Value   Glucose, UA =100 (*)    Spec Grav, UA >=1.030 (*)    All other components within normal limits  POCT FASTING CBG KUC MANUAL ENTRY - Abnormal; Notable for the following components:   POCT Glucose (KUC) 184 (*)    All other components within normal limits  CERVICOVAGINAL ANCILLARY ONLY   Recommend she f/u with PCP to discuss urology evaluation.  Reviewed expectations re: course of current medical issues. Questions answered. Outlined signs and symptoms indicating need for more acute intervention. Patient verbalized understanding. After Visit Summary given.   SUBJECTIVE:  Jamie Lee is a 42 y.o. female who presents with complaint of dysuria and urine frequency X 1 month. She has been taking AZO OTC but ran out so she came in today. Does have DM; not well-controlled. Denies freq thirst. Nocturia x 2 usu. Denies fever/abd pain.   Patient's last menstrual period was 02/10/2023 (exact date).   OBJECTIVE:  Vitals:   02/10/23 1842  BP: 111/79  Pulse: 82  Resp: 18  Temp: 97.6 F (36.4 C)  TempSrc: Oral  SpO2: 98%     General appearance: alert, cooperative, appears stated age and no distress Lungs: unlabored respirations; speaks full sentences without  difficulty Back: no CVA tenderness; FROM at waist Abdomen: soft, non-tender GU: deferred Skin: warm and dry Psychological: alert and cooperative; normal mood and affect.  Results for orders placed or performed during the hospital encounter of 02/10/23  POCT urinalysis dipstick  Result Value Ref Range   Color, UA yellow yellow   Clarity, UA clear clear   Glucose, UA =100 (A) negative mg/dL   Bilirubin, UA negative negative   Ketones, POC UA negative negative mg/dL   Spec Grav, UA >=4.098 (A) 1.010 - 1.025   Blood, UA negative negative   pH, UA 5.0 5.0 - 8.0   Protein Ur, POC negative negative mg/dL   Urobilinogen, UA 0.2 0.2 or 1.0 E.U./dL   Nitrite, UA Negative Negative   Leukocytes, UA Negative Negative  POC CBG monitoring  Result Value Ref Range   POCT Glucose (KUC) 184 (A) 70 - 99 mg/dL    Labs Reviewed  POCT URINALYSIS DIP (MANUAL ENTRY) - Abnormal; Notable for the following components:      Result Value   Glucose, UA =100 (*)    Spec Grav, UA >=1.030 (*)    All other components within normal limits  POCT FASTING CBG KUC MANUAL ENTRY - Abnormal; Notable for the following components:   POCT Glucose (KUC) 184 (*)    All other components within normal limits  CERVICOVAGINAL ANCILLARY ONLY    Allergies  Allergen Reactions   Metformin And Related Diarrhea    Past Medical History:  Diagnosis  Date   Arthritis    Diabetes mellitus without complication (HCC)    Schnyder's crystalline corneal dystrophy    Sciatica    Vision abnormalities    Family History  Problem Relation Age of Onset   Diabetes Maternal Grandfather    Blindness Maternal Grandfather    Heart disease Other    Hypertension Other    Cancer Other    Breast cancer Neg Hx    Social History   Socioeconomic History   Marital status: Married    Spouse name: Not on file   Number of children: Not on file   Years of education: Not on file   Highest education level: Not on file  Occupational History    Not on file  Tobacco Use   Smoking status: Never   Smokeless tobacco: Never  Vaping Use   Vaping status: Never Used  Substance and Sexual Activity   Alcohol use: Yes    Comment: occ   Drug use: No   Sexual activity: Not Currently    Birth control/protection: Pill  Other Topics Concern   Not on file  Social History Narrative   Not on file   Social Determinants of Health   Financial Resource Strain: Not on file  Food Insecurity: Not on file  Transportation Needs: Not on file  Physical Activity: Not on file  Stress: Not on file  Social Connections: Not on file  Intimate Partner Violence: Not on file           Mardella Layman, MD 02/11/23 1016

## 2023-03-08 ENCOUNTER — Ambulatory Visit: Payer: 59

## 2023-03-08 IMAGING — MG MM DIGITAL SCREENING BILAT W/ TOMO AND CAD
6 of 10 series · 6 of 30 positions shown · non-contrast
Comparison: None.

CLINICAL DATA: Screening.

EXAM:
DIGITAL SCREENING BILATERAL MAMMOGRAM WITH TOMOSYNTHESIS AND CAD
TECHNIQUE: Bilateral screening digital craniocaudal and mediolateral oblique
mammograms were obtained. Bilateral screening digital breast
tomosynthesis was performed. The images were evaluated with
computer-aided detection.

[L CC synth-2D (1 of 2)]
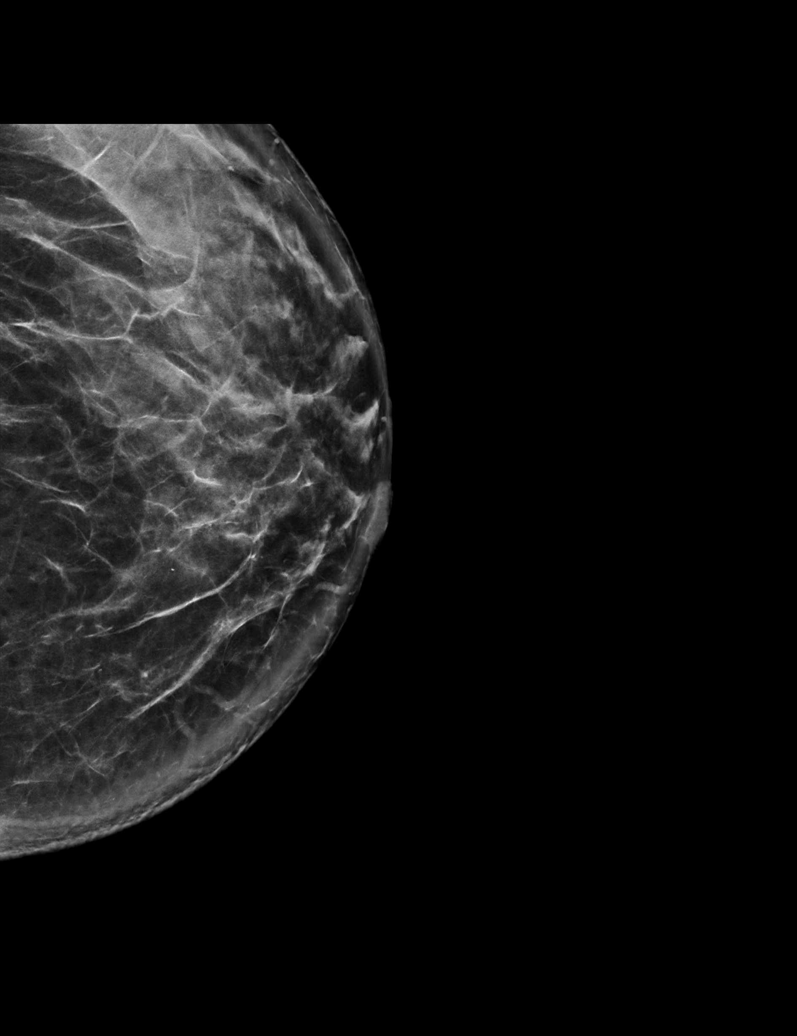

[L CC synth-2D (2 of 2)]
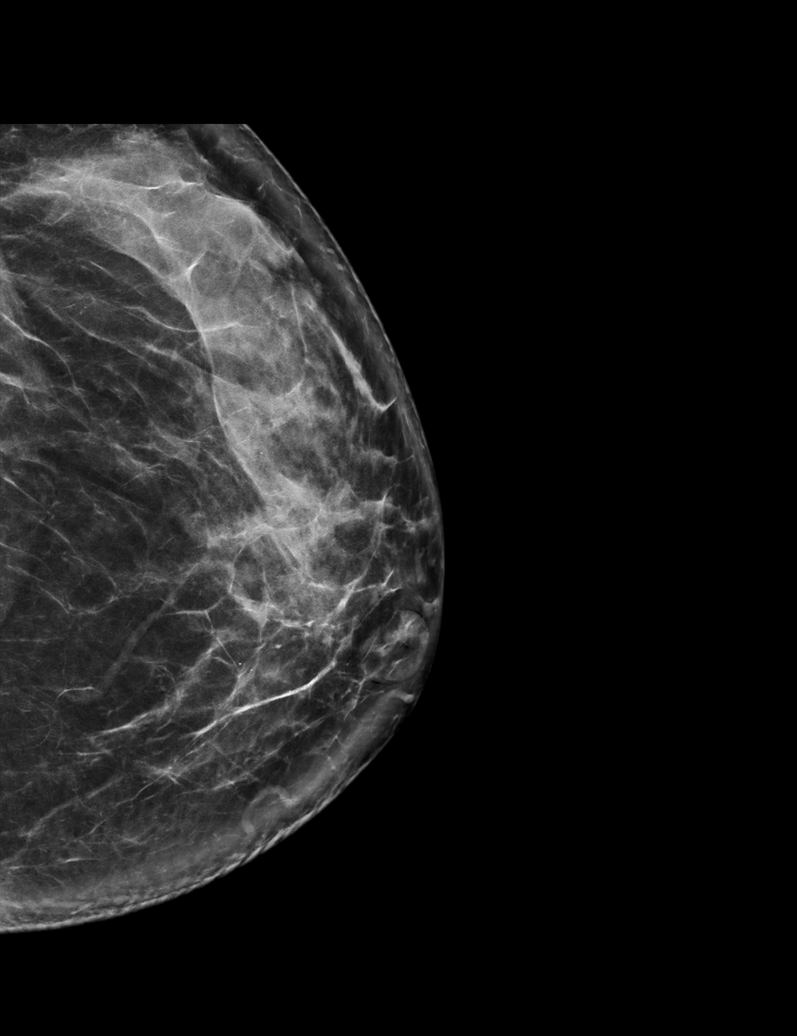

[R MLO synth-2D]
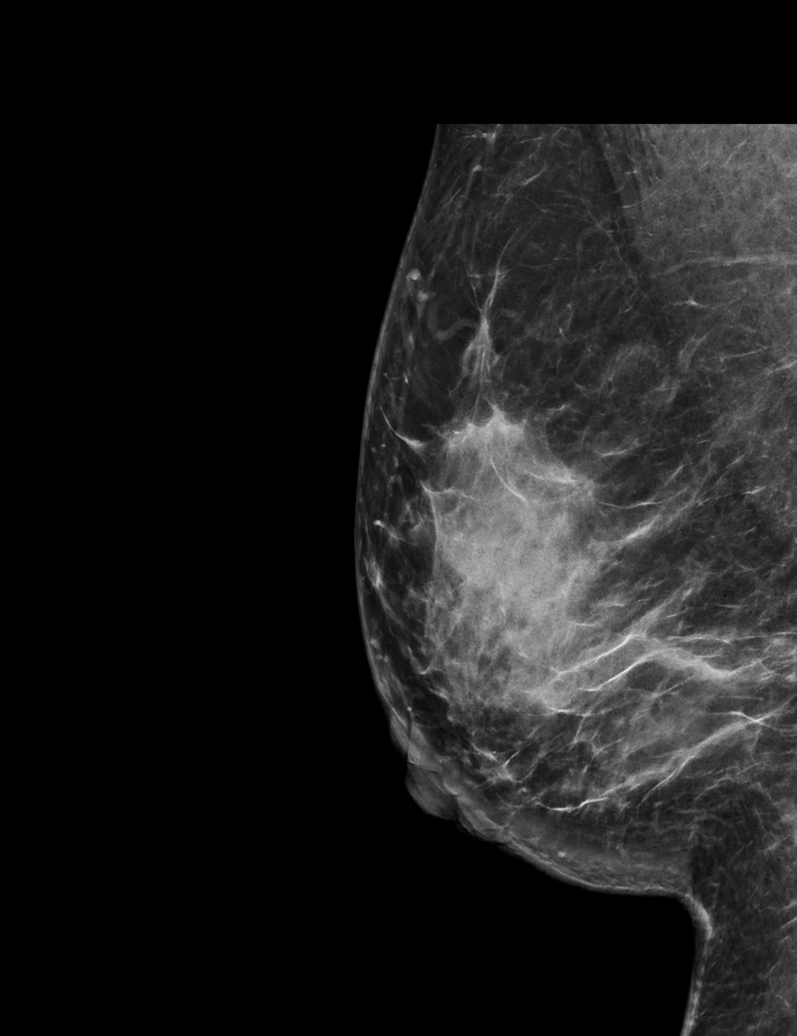

[R CC synth-2D]
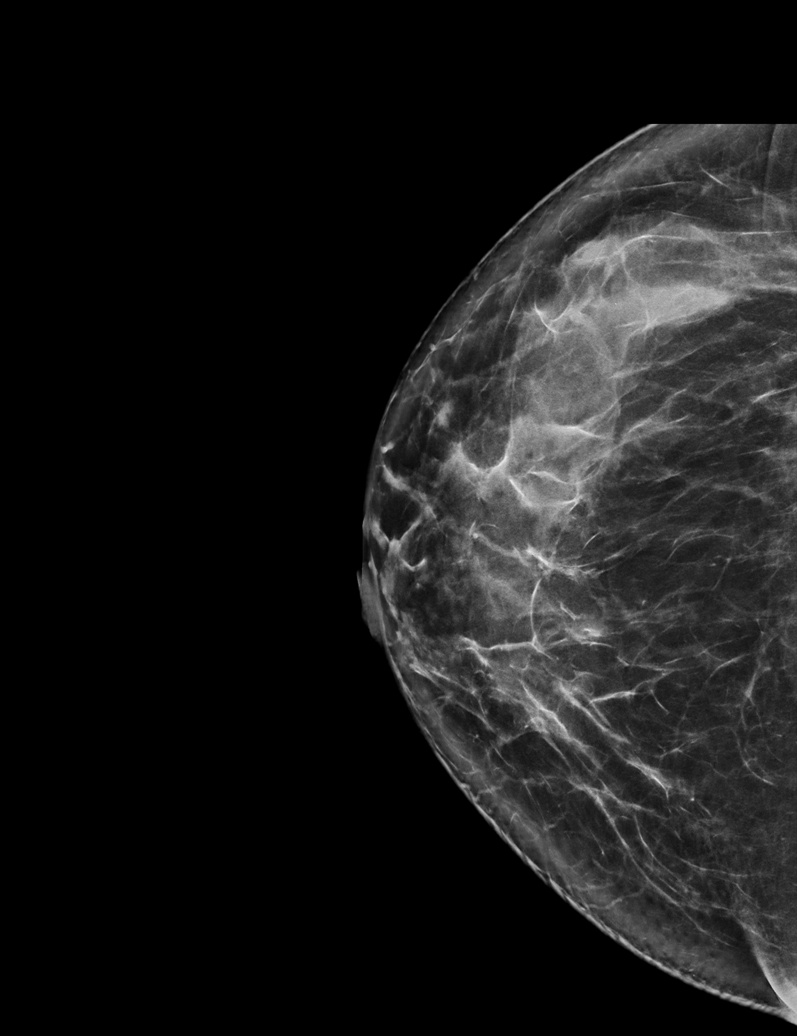

[L MLO synth-2D]
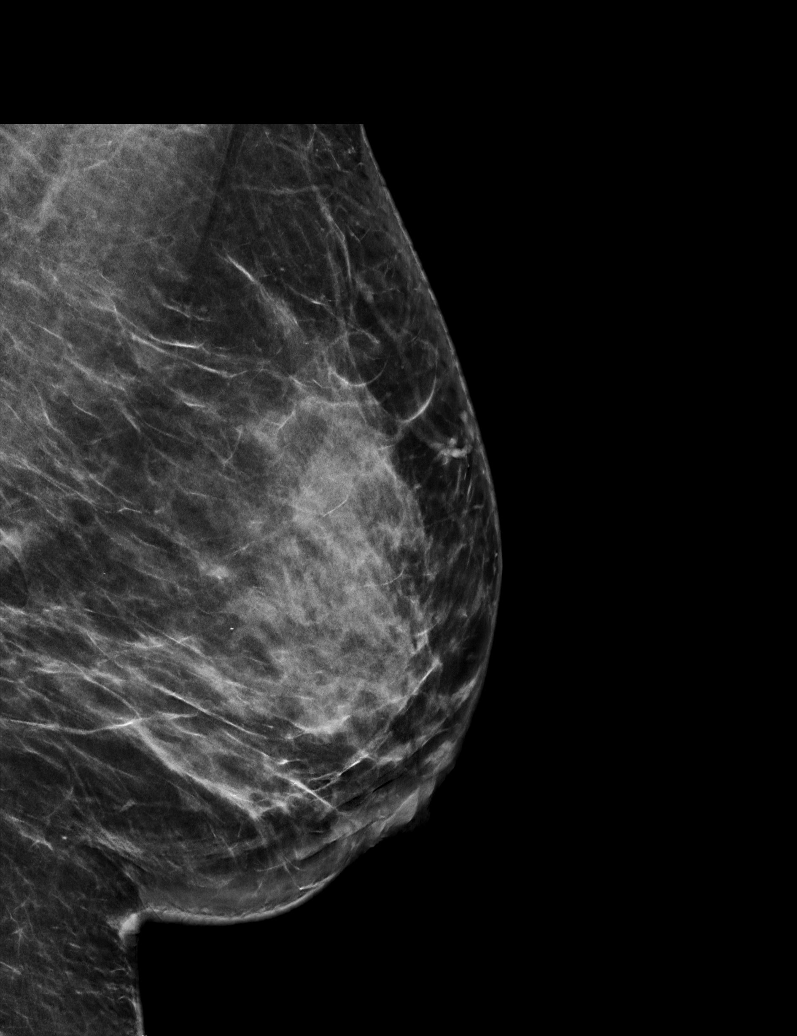

[L CC tomo · tomo slice 40/79.0]
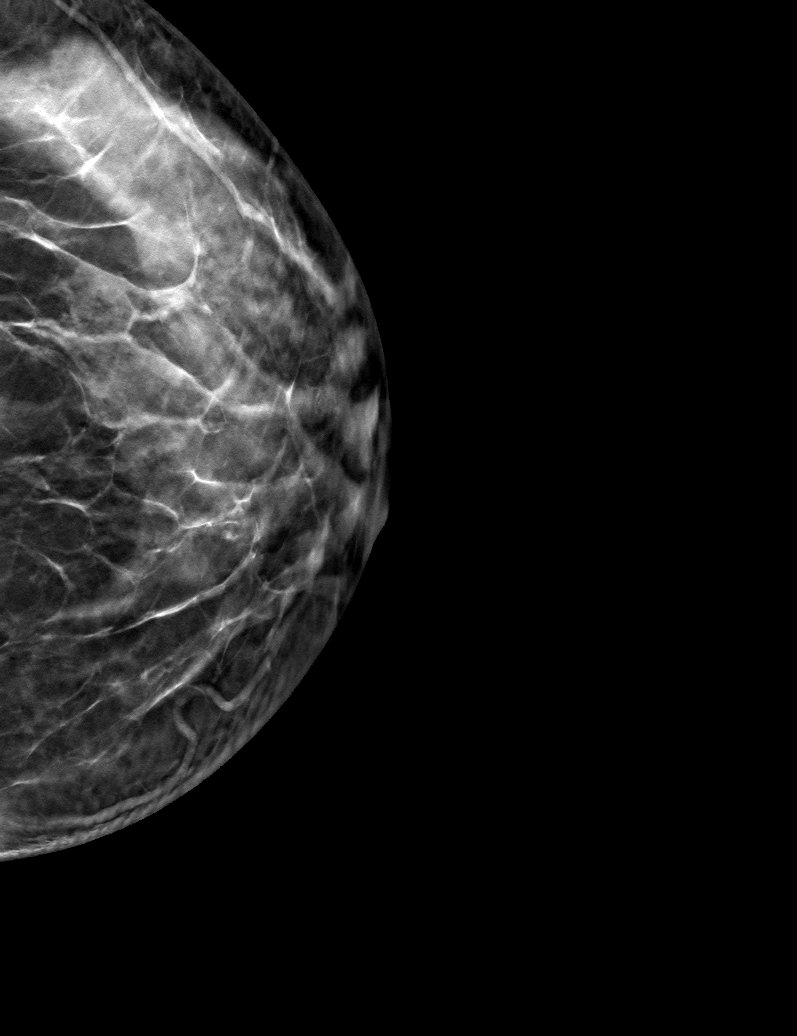

[6 of 30 positions shown; findings below may reference images not displayed]

ACR Breast Density Category c: The breast tissue is heterogeneously
dense, which may obscure small masses
FINDINGS: There are no findings suspicious for malignancy. The images were
evaluated with computer-aided detection.
IMPRESSION: No mammographic evidence of malignancy. A result letter of this
screening mammogram will be mailed directly to the patient.

RECOMMENDATION:
Screening mammogram in one year. (Code:CQ-1-UDH)

BI-RADS CATEGORY  1: Negative.

## 2023-03-24 ENCOUNTER — Other Ambulatory Visit: Payer: Self-pay | Admitting: Internal Medicine

## 2023-03-24 DIAGNOSIS — E1169 Type 2 diabetes mellitus with other specified complication: Secondary | ICD-10-CM

## 2023-04-08 ENCOUNTER — Other Ambulatory Visit: Payer: Self-pay | Admitting: Internal Medicine

## 2023-04-08 DIAGNOSIS — E1165 Type 2 diabetes mellitus with hyperglycemia: Secondary | ICD-10-CM

## 2023-05-06 ENCOUNTER — Other Ambulatory Visit: Payer: Self-pay | Admitting: Internal Medicine

## 2023-05-07 ENCOUNTER — Ambulatory Visit
Admission: RE | Admit: 2023-05-07 | Discharge: 2023-05-07 | Disposition: A | Discharge status: Home / Self Care | Payer: 59 | Source: Ambulatory Visit | Attending: Internal Medicine | Admitting: Internal Medicine

## 2023-05-07 DIAGNOSIS — Z1231 Encounter for screening mammogram for malignant neoplasm of breast: Secondary | ICD-10-CM

## 2023-05-12 ENCOUNTER — Encounter: Payer: Self-pay | Admitting: Internal Medicine

## 2023-05-12 NOTE — Progress Notes (Signed)
Received note from [facility].  Patient was seen 05/10/2023 and had Pap smear done.  I have sent a request to them for [place] to receive a copy of the Pap smear results when it becomes available.

## 2023-05-21 ENCOUNTER — Telehealth: Payer: Self-pay | Admitting: Internal Medicine

## 2023-05-21 DIAGNOSIS — E1169 Type 2 diabetes mellitus with other specified complication: Secondary | ICD-10-CM

## 2023-05-21 MED ORDER — FREESTYLE LIBRE 3 PLUS SENSOR MISC
6 refills | Status: DC
Start: 2023-05-21 — End: 2024-05-25

## 2023-05-21 NOTE — Telephone Encounter (Signed)
Pt called reporting that the pharmacy does not have the sensor 3 and that they need a new Rx for the Sensor 3 plus  [id number] - [place], [place] - [address] AT [facility] OF [address]  [address] [place] [address]  Phone: [phone] Fax: [phone]

## 2023-05-21 NOTE — Telephone Encounter (Signed)
Rxn for [place] 3 plus sent.

## 2023-07-03 IMAGING — CR DG CHEST 2V
2 series · 2 of 2 positions shown · non-contrast
Comparison: None.

CLINICAL DATA: Short of breath today.  Coughing.

EXAM:
CHEST - 2 VIEW

[chest pa]
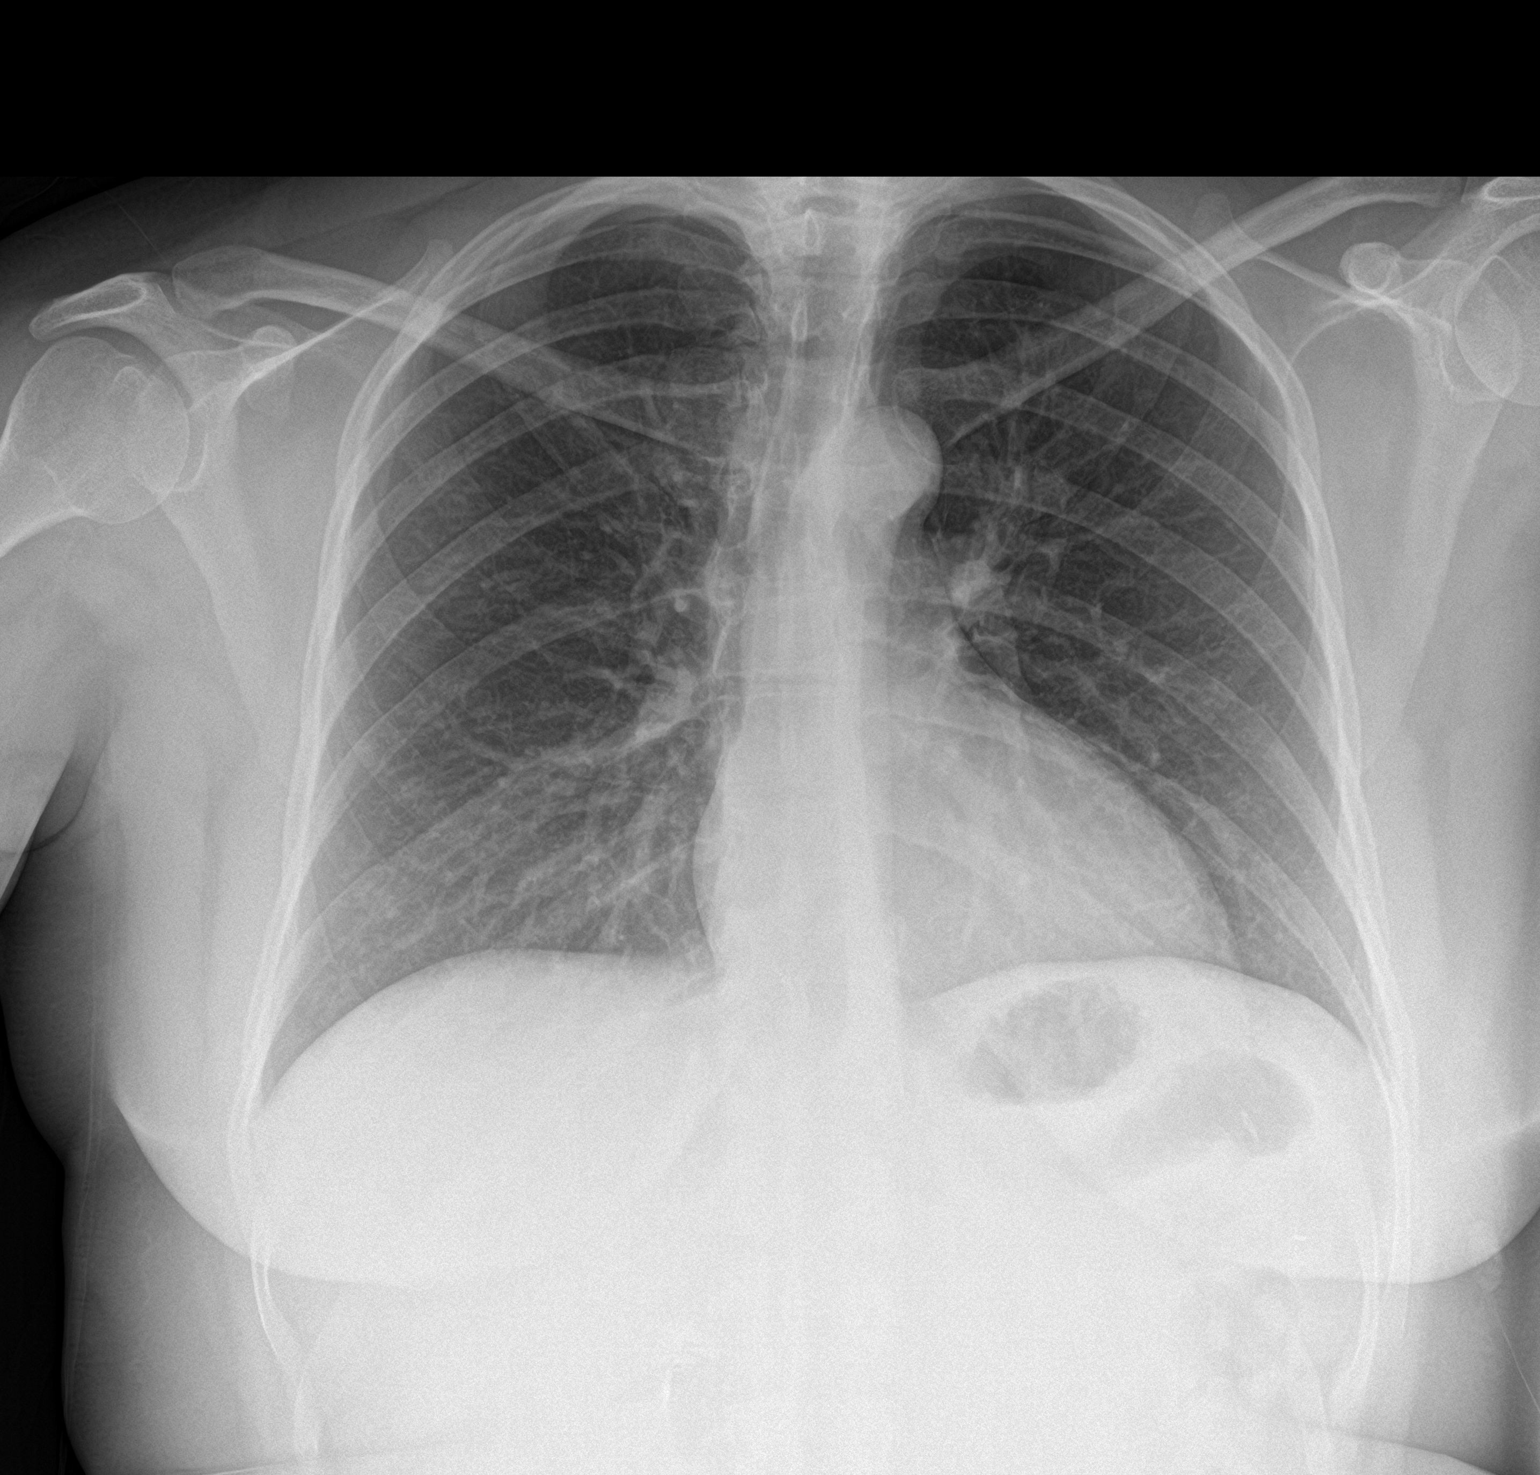

[chest lat]
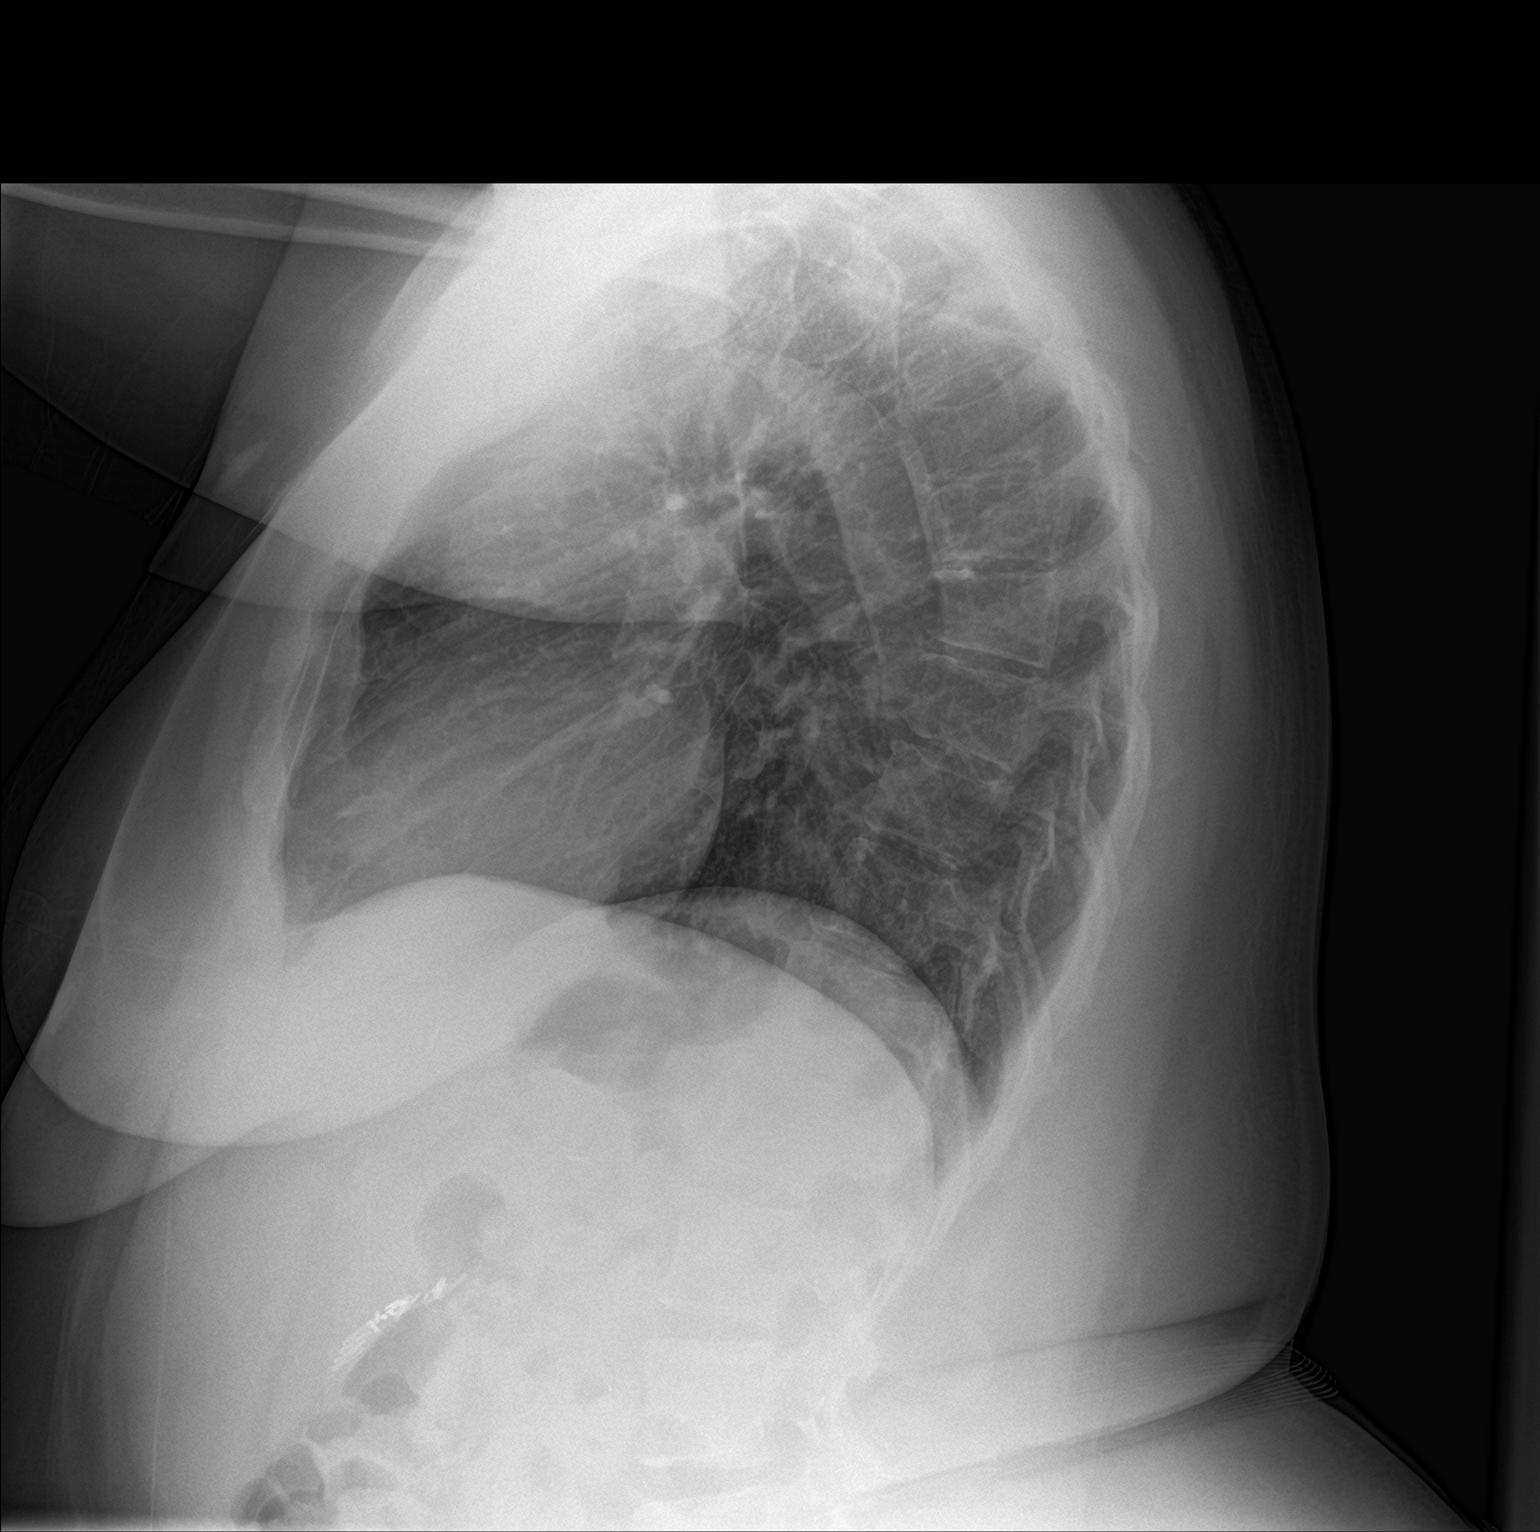

[2 of 2 positions shown; findings below may reference images not displayed]

FINDINGS: Normal heart, mediastinum and hila.

Clear lungs.  No pleural effusion or pneumothorax.

Skeletal structures are intact.
IMPRESSION: No active cardiopulmonary disease.

## 2023-08-25 ENCOUNTER — Other Ambulatory Visit: Payer: Self-pay

## 2023-09-21 ENCOUNTER — Other Ambulatory Visit: Payer: Self-pay | Admitting: Internal Medicine

## 2023-09-21 DIAGNOSIS — F411 Generalized anxiety disorder: Secondary | ICD-10-CM

## 2023-09-22 NOTE — Telephone Encounter (Signed)
 Requested Prescriptions  Pending Prescriptions Disp Refills   busPIRone (BUSPAR) 7.5 MG tablet [Pharmacy Med Name: BUSPIRONE 7.5MG  TABLETS] 90 tablet 4    Sig: TAKE 1 TABLET(7.5 MG) BY MOUTH THREE TIMES DAILY     Psychiatry: Anxiolytics/Hypnotics - Non-controlled Passed - 09/22/2023  2:18 PM      Passed - Valid encounter within last 12 months    Recent Outpatient Visits           8 months ago Type 2 diabetes mellitus with morbid obesity (HCC)   [facility] Of [place]. [facility] Jonah Blue B, MD   11 months ago Type 2 diabetes mellitus with hyperglycemia, without long-term current use of insulin [facility])   [facility] Of [place]. [facility] Marcine Matar, MD   1 year ago Major depressive disorder, recurrent episode, moderate (HCC)   [facility] - [facility] Dept Of [place]. [facility] Jonah Blue B, MD   1 year ago Type 2 diabetes mellitus with hyperglycemia, without long-term current use of insulin [facility])   [facility] Dept Of [place]. [facility] Lois Huxley, Gleed L, RPH-CPP   1 year ago Type 2 diabetes mellitus with hyperglycemia, without long-term current use of insulin (HCC)   [facility] Dept Of [place]. [facility] Marcine Matar, MD

## 2023-10-21 ENCOUNTER — Other Ambulatory Visit: Payer: Self-pay | Admitting: Internal Medicine

## 2023-11-15 ENCOUNTER — Other Ambulatory Visit: Payer: Self-pay | Admitting: Internal Medicine

## 2023-11-15 DIAGNOSIS — E1165 Type 2 diabetes mellitus with hyperglycemia: Secondary | ICD-10-CM

## 2023-11-19 ENCOUNTER — Other Ambulatory Visit: Payer: Self-pay | Admitting: Internal Medicine

## 2023-12-30 ENCOUNTER — Ambulatory Visit: Admitting: Pharmacist

## 2024-01-17 ENCOUNTER — Ambulatory Visit (INDEPENDENT_AMBULATORY_CARE_PROVIDER_SITE_OTHER): Admitting: Licensed Clinical Social Worker

## 2024-01-17 ENCOUNTER — Telehealth (HOSPITAL_COMMUNITY): Payer: Self-pay | Admitting: Psychiatry

## 2024-01-17 DIAGNOSIS — F331 Major depressive disorder, recurrent, moderate: Secondary | ICD-10-CM | POA: Diagnosis not present

## 2024-01-17 DIAGNOSIS — F4321 Adjustment disorder with depressed mood: Secondary | ICD-10-CM | POA: Diagnosis not present

## 2024-01-17 DIAGNOSIS — F411 Generalized anxiety disorder: Secondary | ICD-10-CM | POA: Diagnosis not present
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
## 2024-01-17 NOTE — Telephone Encounter (Signed)
 D:  Lynwood Maris, LCSW referred pt to virtual MH-IOP.  A:  Placed call to orient pt and provide her with a start date, but there was no answer.  Left vm requesting pt to call the case manager back re: group.  Informed James.

## 2024-01-20 ENCOUNTER — Telehealth (HOSPITAL_COMMUNITY): Payer: Self-pay | Admitting: Psychiatry

## 2024-01-20 ENCOUNTER — Encounter (HOSPITAL_COMMUNITY): Payer: Self-pay | Admitting: Licensed Clinical Social Worker

## 2024-01-20 NOTE — Telephone Encounter (Signed)
 D:  Jamie Maris, LCSW referred pt to virtual MH-IOP.  A:  Placed second call to pt, but there was still no answer.  Left another voicemail requesting pt to call the case manager back.  Inform James.

## 2024-01-22 ENCOUNTER — Ambulatory Visit (HOSPITAL_COMMUNITY)
Admission: RE | Admit: 2024-01-22 | Discharge: 2024-01-22 | Disposition: A | Discharge status: Home / Self Care | Source: Ambulatory Visit | Attending: Family Medicine | Admitting: Family Medicine

## 2024-01-22 ENCOUNTER — Encounter (HOSPITAL_COMMUNITY): Payer: Self-pay

## 2024-01-22 VITALS — BP 106/71 | HR 89 | Temp 98.4°F | Resp 16

## 2024-01-22 DIAGNOSIS — M2550 Pain in unspecified joint: Secondary | ICD-10-CM | POA: Diagnosis not present

## 2024-01-22 MED ORDER — PREDNISONE 10 MG (21) PO TBPK: ORAL_TABLET | ORAL | 0 refills | Status: DC

## 2024-01-22 NOTE — ED Provider Notes (Signed)
 [facility]    CSN: [id number] Arrival date & time: 01/22/24  1332      History   Chief Complaint Chief Complaint  Patient presents with   Joint Pain    All joints and lower back are in pain but knee pain on both sides is the main concern more my right knee than left. It is burning, throbbing, aching , and shooting pain. It started this bad about 1 week ago. I'm have been working out a lot. - Entered by patient    HPI Jamie Lee is a 43 y.o. female.   The history is provided by the patient.  Joint pains multiple joints this is an ongoing chronic condition, saw a rheumatologist several years ago but never completed the workup.  She has been treating it with ibuprofen , Aleve and Tylenol .  States her symptoms have worsened over the last 2 weeks it affects mostly her back and her knees.  At times will have joint swelling or joint warmth denies redness, rash, fever, chills, sweats, URI symptoms, change in weight. She is a type II diabetic.  Past Medical History:  Diagnosis Date   Arthritis    Diabetes mellitus without complication (HCC)    Schnyder's crystalline corneal dystrophy    Sciatica    Vision abnormalities     Patient Active Problem List   Diagnosis Date Noted   Tetanus, diphtheria, and acellular pertussis (Tdap) vaccination declined 10/13/2022   Type 2 diabetes mellitus with morbid obesity (HCC) 01/22/2022   GAD (generalized anxiety disorder) 01/22/2022   Major depressive disorder, single episode, moderate (HCC) 01/22/2022    Past Surgical History:  Procedure Laterality Date   CHOLECYSTECTOMY     EYE SURGERY Bilateral     OB History   No obstetric history on file.      Home Medications    Prior to Admission medications   Medication Sig Start Date End Date Taking? Authorizing Provider  predniSONE  (STERAPRED UNI-PAK 21 TAB) 10 MG (21) TBPK tablet Take as directed 01/22/24  Yes Rayona Sardinha, PA  Accu-Chek Softclix Lancets lancets Use as  instructed 01/22/22   Vicci Barnie NOVAK, MD  Blood Glucose Monitoring Suppl (ACCU-CHEK GUIDE) w/Device KIT UAD 01/22/22   Vicci Barnie NOVAK, MD  busPIRone  (BUSPAR ) 7.5 MG tablet TAKE 1 TABLET(7.5 MG) BY MOUTH THREE TIMES DAILY 09/22/23   Vicci Barnie NOVAK, MD  Continuous Blood Gluc Receiver (FREESTYLE LIBRE 3 READER) DEVI 1 Device by Does not apply route daily. ICD E11.69, E66.01 10/14/22   Vicci Barnie NOVAK, MD  Continuous Glucose Sensor (FREESTYLE LIBRE 3 PLUS SENSOR) MISC Use to check blood sugar continuously throughout the day. Change sensor every 15 days. E11.69 05/21/23   Vicci Barnie NOVAK, MD  diclofenac  sodium (VOLTAREN ) 1 % GEL Apply 2 g topically 4 (four) times daily. 06/16/18   Doretha Folks, MD  Dulaglutide  (TRULICITY ) 1.5 MG/0.5ML SOAJ Inject 1.5 mg into the skin once a week. Must have office visit for refills 11/15/23   Vicci Barnie NOVAK, MD  escitalopram  (LEXAPRO ) 20 MG tablet TAKE 1 TABLET(20 MG) BY MOUTH DAILY 01/22/23   Vicci Barnie NOVAK, MD  glimepiride  (AMARYL ) 4 MG tablet Take 1 tablet (4 mg total) by mouth daily with breakfast. Please schedule appointment w/ Dr. Vicci for more refills. 11/19/23   Vicci Barnie NOVAK, MD  glucose blood (ACCU-CHEK GUIDE) test strip Use as instructed 01/22/22   Vicci Barnie NOVAK, MD  JUNEL FE 1.5/30 1.5-30 MG-MCG tablet Take 1 tablet by mouth daily.  03/23/21   [provider]    Family History Family History  Problem Relation Age of Onset   Diabetes Maternal Grandfather    Blindness Maternal Grandfather    Heart disease Other    Hypertension Other    Cancer Other    Breast cancer Neg Hx     Social History Social History   Tobacco Use   Smoking status: Never   Smokeless tobacco: Never  Vaping Use   Vaping status: Never Used  Substance Use Topics   Alcohol use: Yes    Comment: occ   Drug use: No     Allergies   Metformin and related   Review of Systems Review of Systems   Physical Exam Triage Vital  Signs ED Triage Vitals  Encounter Vitals Group     BP 01/22/24 1356 106/71     Girls Systolic BP Percentile --      Girls Diastolic BP Percentile --      Boys Systolic BP Percentile --      Boys Diastolic BP Percentile --      Pulse Rate 01/22/24 1356 89     Resp 01/22/24 1356 16     Temp 01/22/24 1356 98.4 F (36.9 C)     Temp Source 01/22/24 1356 Oral     SpO2 01/22/24 1356 94 %     Weight --      Height --      Head Circumference --      Peak Flow --      Pain Score 01/22/24 1355 7     Pain Loc --      Pain Education --      Exclude from Growth Chart --    No data found.  Updated Vital Signs BP 106/71 (BP Location: Left Arm)   Pulse 89   Temp 98.4 F (36.9 C) (Oral)   Resp 16   LMP 12/26/2023 (Approximate)   SpO2 94%   Visual Acuity Right Eye Distance:   Left Eye Distance:   Bilateral Distance:    Right Eye Near:   Left Eye Near:    Bilateral Near:     Physical Exam Vitals and nursing note reviewed.  Constitutional:      Appearance: She is not ill-appearing.  HENT:     Head: Normocephalic.   Cardiovascular:     Rate and Rhythm: Normal rate.     Heart sounds: Normal heart sounds.  Pulmonary:     Effort: Pulmonary effort is normal.     Breath sounds: Normal breath sounds.   Musculoskeletal:     Comments: Hands wrists elbows knees examined no visible edema, no erythema, no increased heat, no overlying skin changes   Skin:    General: Skin is dry.     Findings: No erythema.   Neurological:     Mental Status: She is alert.   Psychiatric:        Mood and Affect: Mood normal.      UC Treatments / Results  Labs (all labs ordered are listed, but only abnormal results are displayed) Labs Reviewed - No data to display  EKG   Radiology No results found.  Procedures Procedures (including critical care time)  Medications Ordered in UC Medications - No data to display  Initial Impression / Assessment and Plan / UC Course  I have reviewed  the triage vital signs and the nursing notes.  Pertinent labs & imaging results that were available during my care of the  patient were reviewed by me and considered in my medical decision making (see chart for details).     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  Chart was reviewed, we will try a short course of steroids, patient was counseled to follow-up with her PCP states she has a virtual visit already scheduled, recommend she see rheumatology as well. Final Clinical Impressions(s) / UC Diagnoses   Final diagnoses:  Polyarthralgia     Discharge Instructions      Follow-up with your primary care doctor as discussed for referral back to rheumatology Do not take Advil , ibuprofen , Motrin  or Aleve while taking the steroids.  You can take Tylenol /Aminofen while taking the steroids    ED Prescriptions     Medication Sig Dispense Auth. Provider   predniSONE  (STERAPRED UNI-PAK 21 TAB) 10 MG (21) TBPK tablet Take as directed 21 tablet Judd Mccubbin, PA      PDMP not reviewed this encounter.   Bobbyjo Marulanda, [place] 01/22/24 1432

## 2024-01-22 NOTE — ED Triage Notes (Signed)
 Patient reports that she has joint pain all over x 2 1/2 weeks, but worse in the past week. Patient states her knees and lower back are the worst.  Patient states she has been taking Ibuprofen , Aleve, and Tylenol .

## 2024-01-22 NOTE — Discharge Instructions (Signed)
 Follow-up with your primary care doctor as discussed for referral back to rheumatology Do not take Advil , ibuprofen , Motrin  or Aleve while taking the steroids.  You can take Tylenol /Aminofen while taking the steroids

## 2024-01-25 ENCOUNTER — Telehealth (HOSPITAL_COMMUNITY): Payer: Self-pay | Admitting: Licensed Clinical Social Worker

## 2024-02-01 ENCOUNTER — Ambulatory Visit: Attending: Internal Medicine | Admitting: Internal Medicine

## 2024-02-01 ENCOUNTER — Telehealth (HOSPITAL_COMMUNITY): Payer: Self-pay | Admitting: Psychiatry

## 2024-02-01 DIAGNOSIS — Z538 Procedure and treatment not carried out for other reasons: Secondary | ICD-10-CM

## 2024-02-01 NOTE — Progress Notes (Unsigned)
 Patient ID: Jamie Lee, female   DOB: 11/15/1980, 43 y.o.   MRN: [id number] Virtual Visit via Video Note  I connected with Jamie Lee on 02/01/2024 at 12:29 PM by a video enabled telemedicine application and verified that I am speaking with the correct person using two identifiers.  Location: Patient: in her car and at one point appeared to be driving Provider: Office   I discussed the limitations of evaluation and management by telemedicine and the availability of in person appointments. The patient expressed understanding and agreed to proceed.  History of Present Illness: Patient with history of DM type II, anxiety, MDD.  This appointment was supposed to be to discuss FMLA.  However patient states that she had other issues to discuss.  She told me that she will start an IOP program tomorrow and has been out of work for 3 weeks.  Tells me that she has been seen someone which I assume is for her mental health but she did not want to go back to see him.  Reason for not wanting to go back is that she does not want insurance knowing her diagnosis.  Tells me that she is scheduled with someone else who is willing to work with her.  She wonders about getting a lab test done.     Outpatient Encounter Medications as of 02/01/2024  Medication Sig   Accu-Chek Softclix Lancets lancets Use as instructed   Blood Glucose Monitoring Suppl (ACCU-CHEK GUIDE) w/Device KIT UAD   busPIRone  (BUSPAR ) 7.5 MG tablet TAKE 1 TABLET(7.5 MG) BY MOUTH THREE TIMES DAILY   Continuous Blood Gluc Receiver (FREESTYLE LIBRE 3 READER) DEVI 1 Device by Does not apply route daily. ICD E11.69, E66.01   Continuous Glucose Sensor (FREESTYLE LIBRE 3 PLUS SENSOR) MISC Use to check blood sugar continuously throughout the day. Change sensor every 15 days. E11.69   diclofenac  sodium (VOLTAREN ) 1 % GEL Apply 2 g topically 4 (four) times daily.   Dulaglutide  (TRULICITY ) 1.5 MG/0.5ML SOAJ Inject 1.5 mg into the skin once a week. Must  have office visit for refills   escitalopram  (LEXAPRO ) 20 MG tablet TAKE 1 TABLET(20 MG) BY MOUTH DAILY   glimepiride  (AMARYL ) 4 MG tablet Take 1 tablet (4 mg total) by mouth daily with breakfast. Please schedule appointment w/ Dr. Vicci for more refills.   glucose blood (ACCU-CHEK GUIDE) test strip Use as instructed   JUNEL FE 1.5/30 1.5-30 MG-MCG tablet Take 1 tablet by mouth daily.   predniSONE  (STERAPRED UNI-PAK 21 TAB) 10 MG (21) TBPK tablet Take as directed   No facility-administered encounter medications on file as of 02/01/2024.       Observations/Objective: Middle-aged African-American female in NAD with same distracted and very tangential conversation  Assessment and Plan: After 7 minutes of talking with this patient, I was not able to get a good idea of what she needed from us  or what she wanted to talk about.  She kept going off in tangents.  We have not seen her since June of last year.  I recommend that we try to do this visit in person first to get a better understanding of her needs at follow-up on her diabetes.  Patient was agreeable. I also though it was not safe for her to be driving and trying to have this visit  Follow Up Instructions: Message sent to scheduler to schedule in-person visit   I discussed the assessment and treatment plan with the patient. The patient was provided an opportunity to ask  questions and all were answered. The patient agreed with the plan and demonstrated an understanding of the instructions.   The patient was advised to call back or seek an in-person evaluation if the symptoms worsen or if the condition fails to improve as anticipated.  I spent 7 minutes talking and listening to pt before deciding an in-person visit would be more appropriate  This note has been created with Education officer, environmental. Any transcriptional errors are unintentional.  Barnie Louder, MD

## 2024-02-01 NOTE — Telephone Encounter (Signed)
 D:  Pt called inquiring about virtual MH-IOP.  States her therapist Sydnee Maris, [place]) had referred her a couple of weeks ago.  According to pt, she has been seeing another provider for therapy, but it's not helping.  I thought I would find someone that I could self pay so that I wouldn't have to go through my insurance so my job wouldn't know, but he's not helping me at all.  I feel worse.  I'm getting nowhere.  I have been out of work for three weeks and no better.  Pt reports she has been written out of work until 02-07-24 [place]).  Pt was very tearful with tangential thinking.  A:  Oriented pt.  Answered her questions.  Encouraged pt to verify her insurance benefits.  Scheduled pt to start virtual MH-IOP tomorrow at 9 a.m.  Inform Lynwood Maris, LCSW.  R:  Pt receptive.

## 2024-02-02 ENCOUNTER — Encounter (HOSPITAL_COMMUNITY): Payer: Self-pay

## 2024-02-02 ENCOUNTER — Other Ambulatory Visit (HOSPITAL_COMMUNITY): Attending: Psychiatry | Admitting: Psychiatry

## 2024-02-02 ENCOUNTER — Encounter (HOSPITAL_COMMUNITY): Payer: Self-pay | Admitting: Psychiatry

## 2024-02-02 DIAGNOSIS — F411 Generalized anxiety disorder: Secondary | ICD-10-CM | POA: Insufficient documentation

## 2024-02-02 DIAGNOSIS — F0630 Mood disorder due to known physiological condition, unspecified: Secondary | ICD-10-CM | POA: Insufficient documentation

## 2024-02-02 DIAGNOSIS — Z760 Encounter for issue of repeat prescription: Secondary | ICD-10-CM | POA: Diagnosis not present

## 2024-02-02 DIAGNOSIS — Z79899 Other long term (current) drug therapy: Secondary | ICD-10-CM | POA: Diagnosis not present

## 2024-02-02 DIAGNOSIS — F331 Major depressive disorder, recurrent, moderate: Secondary | ICD-10-CM | POA: Diagnosis not present

## 2024-02-02 MED ORDER — ARIPIPRAZOLE 5 MG PO TABS: 5.0000 mg | ORAL_TABLET | Freq: Every day | ORAL | 0 refills | Status: DC

## 2024-02-02 NOTE — Progress Notes (Addendum)
 Virtual Visit via Video Note  I connected with Jamie Lee on 02/02/24 at  9:00 AM EDT by a video enabled telemedicine application and verified that I am speaking with the correct person using two identifiers.  Location: Patient: home Provider: office   I discussed the limitations of evaluation and management by telemedicine and the availability of in person appointments. The patient expressed understanding and agreed to proceed.    I discussed the assessment and treatment plan with the patient. The patient was provided an opportunity to ask questions and all were answered. The patient agreed with the plan and demonstrated an understanding of the instructions.   The patient was advised to call back or seek an in-person evaluation if the symptoms worsen or if the condition fails to improve as anticipated.  I provided 15 minutes of non-face-to-face time during this encounter.   Staci LOISE Kerns, NP    Psychiatric Initial Adult Assessment   Patient Identification: Jamie Lee MRN:  [id number] Date of Evaluation:  02/02/2024 Referral Source: DOROTHA Maris LCSW Chief Complaint:  worsening depression and anxiety symptoms Chief Complaint  Patient presents with   Depression   Anxiety   Stress   Visit Diagnosis:    ICD-10-CM   1. GAD (generalized anxiety disorder)  F41.1     2. MDD (major depressive disorder), recurrent episode, moderate (HCC)  F33.1     3. Mood disorder in conditions classified elsewhere  F06.30       History of Present Illness:  Jamie Lee is a 43 year old African-American female who presents after referral by her therapist Lynwood Maris. [name] stated I was having a really bad day.  Prior to the referral of intensive outpatient programming.  Reported her previous therapist was and able to speak to her the she was struggling with depression and anxiet.  States she then contacted Lynwood Maris who referred the program.    States her primary care provider  Cara Louder had initiated her on Lexapro  which she feels that the medication has not been helping her symptoms.  States she has since discontinued medications as she was prescribed Lexapro  and BuSpar  7.5 mg.  She reports BuSpar  was increased, however, states that her doctor is a holistic doctor so he did not feel comfortable adjusting her medications at that time.  She denied that she is currently taking any medications to help with her symptoms.  Shakier reports increased anxiety, mood irritability, depression, overthinking, symptoms of worry and bouts of crying uncontrollably.  Reports her main stressors are related to her current employer and past unresolved grief and loss.  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   She denied previous inpatient admissions.  Denied history of illicit drug use or substance abuse.  Denied auditory visual hallucinations.  However throughout this assessment appears to be concrete slightly paranoid and catastrophizing.   If this program does not work with then will I be homeless?  Lynnex reports she has 3 sons that she cares for 4 year old, 81 year old and 44 year old and stated se has been on medial leave. Patient to start Intensive  outpatient Programming (IOP) on 02/02/2024  -  Sabrie has some apprehension related to continuing in the program, however discussed that she would give the program a try.  Will initiate Abilify  5 mg p.o. daily  [name] reports she has tried Lexapro , Zoloft and BuSpar  without any relief in the past.  Denied documented family history of mental illness.  Jamie Lee is seen and evaluated via virtual platform;she is alert/oriented x 4; calm/cooperative; and mood congruent with affect.  Patient is speaking in a clear tone at moderate volume, and normal pace; with good eye contact.   Her thought process is coherent and relevant; she appears  hypervigilant and slightly paranoid throughout this assessment.  She is currently responding to internal/external stimuli or experiencing delusional thought content.  Patient denies suicidal/self-harm/homicidal ideation, psychosis, and paranoia.  Patient has remained calm throughout assessment and has answered questions appropriately.    Associated Signs/Symptoms: Depression Symptoms:  depressed mood, anhedonia, feelings of worthlessness/guilt, difficulty concentrating, (Hypo) Manic Symptoms:  Distractibility, Irritable Mood, Anxiety Symptoms:  Excessive Worry, Psychotic Symptoms:  Hallucinations: None PTSD Symptoms: Grief and loss symptoms.  Past Psychiatric History: denied   Previous Psychotropic Medications: Yes   Substance Abuse History in the last 12 months:  No.  Consequences of Substance Abuse: NA  Past Medical History:  Past Medical History:  Diagnosis Date   Arthritis    Diabetes mellitus without complication (HCC)    Schnyder's crystalline corneal dystrophy    Sciatica    Vision abnormalities     Past Surgical History:  Procedure Laterality Date   CHOLECYSTECTOMY     EYE SURGERY Bilateral     Family Psychiatric History: denied documented history  Family History:  Family History  Problem Relation Age of Onset   Diabetes Maternal Grandfather    Blindness Maternal Grandfather    Heart disease Other    Hypertension Other    Cancer Other    Breast cancer Neg Hx     Social History:   Social History   Socioeconomic History   Marital status: Married    Spouse name: Not on file   Number of children: Not on file   Years of education: Not on file   Highest education level: Not on file  Occupational History   Not on file  Tobacco Use   Smoking status: Never   Smokeless tobacco: Never  Vaping Use   Vaping status: Never Used  Substance and Sexual Activity   Alcohol use: Yes    Comment: occ   Drug use: No   Sexual activity: Not Currently    Birth  control/protection: Pill  Other Topics Concern   Not on file  Social History Narrative   Not on file   Social Drivers of Health   Financial Resource Strain: Not on file  Food Insecurity: Not on file  Transportation Needs: Not on file  Physical Activity: Not on file  Stress: Not on file  Social Connections: Not on file    Additional Social History:   Allergies:   Allergies  Allergen Reactions   Metformin And Related Diarrhea    Metabolic Disorder Labs: Lab Results  Component Value Date   HGBA1C 9.3 (H) 02/03/2023   No results found for: PROLACTIN Lab Results  Component Value Date   CHOL 160 02/03/2023   TRIG 77 02/03/2023   HDL 58 02/03/2023   CHOLHDL 2.8 02/03/2023   LDLCALC 87 02/03/2023   LDLCALC 88 01/22/2022   No results found for: TSH  Therapeutic Level Labs: No results found for: LITHIUM No results found for: CBMZ No results found for: VALPROATE  Current Medications: Current Outpatient Medications  Medication Sig Dispense Refill   Accu-Chek Softclix Lancets lancets Use as instructed 100 each 12   Blood Glucose Monitoring Suppl (ACCU-CHEK GUIDE) w/Device KIT UAD 1 kit 0   busPIRone  (BUSPAR ) 7.5 MG tablet TAKE  1 TABLET(7.5 MG) BY MOUTH THREE TIMES DAILY 270 tablet 0   Continuous Blood Gluc Receiver (FREESTYLE LIBRE 3 READER) DEVI 1 Device by Does not apply route daily. ICD E11.69, E66.01 1 each 0   Continuous Glucose Sensor (FREESTYLE LIBRE 3 PLUS SENSOR) MISC Use to check blood sugar continuously throughout the day. Change sensor every 15 days. E11.69 2 each 6   diclofenac  sodium (VOLTAREN ) 1 % GEL Apply 2 g topically 4 (four) times daily. 100 g 1   Dulaglutide  (TRULICITY ) 1.5 MG/0.5ML SOAJ Inject 1.5 mg into the skin once a week. Must have office visit for refills 2 mL 0   escitalopram  (LEXAPRO ) 20 MG tablet TAKE 1 TABLET(20 MG) BY MOUTH DAILY 90 tablet 1   glimepiride  (AMARYL ) 4 MG tablet Take 1 tablet (4 mg total) by mouth daily with  breakfast. Please schedule appointment w/ Dr. Vicci for more refills. 30 tablet 0   glucose blood (ACCU-CHEK GUIDE) test strip Use as instructed 100 each 12   JUNEL FE 1.5/30 1.5-30 MG-MCG tablet Take 1 tablet by mouth daily.     predniSONE  (STERAPRED UNI-PAK 21 TAB) 10 MG (21) TBPK tablet Take as directed 21 tablet 0   ARIPiprazole  (ABILIFY ) 5 MG tablet Take 1 tablet (5 mg total) by mouth daily. 30 tablet 0   No current facility-administered medications for this visit.    Musculoskeletal: Virtual assessment   Psychiatric Specialty Exam: Review of Systems  Last menstrual period 12/26/2023.There is no height or weight on file to calculate BMI.  General Appearance: Casual  Eye Contact:  Good  Speech:  Clear and Coherent  Volume:  Normal  Mood:  Anxious, Depressed, and Irritable  Affect:  Depressed  Thought Process:  Linear and Descriptions of Associations: Circumstantial  Orientation:  Full (Time, Place, and Person)  Thought Content:  Logical  Suicidal Thoughts:  No  Homicidal Thoughts:  No  Memory:  Immediate;   Good Recent;   Fair  Judgement:  Fair  Insight:  Lacking  Psychomotor Activity:  NA  Concentration:  Concentration: Poor  Recall:  Fair  Fund of Knowledge:Fair  Language: Good  Akathisia:  No  Handed:  Right  AIMS (if indicated):  not done  Assets:  Communication Skills Desire for Improvement  ADL's:  Intact  Cognition: WNL  Sleep:  Poor   Screenings: GAD-7    Advertising copywriter from 01/17/2024 in [facility] at [facility] Visit from 10/13/2022 in [facility] [employer] - A Dept Of [place] Counselor from 03/11/2022 in [facility] at [facility] Visit from 01/22/2022 in [facility] - A Dept Of [facility]. [facility] Office Visit from 06/27/2018 in [facility] Primary Care at [facility]  Total GAD-7 Score 21 7 18 21 20     PHQ2-9    Flowsheet Row Counselor from 01/17/2024 in [facility] at [facility] Nutrition from 11/25/2022 in [facility]ab [facility]  - A Dept Of . [facility] Office Visit from 10/13/2022 in [facility] [place] - A Dept Of [place] [facility]. [facility] Counselor from 03/11/2022 in [facility] Out[facility] at [facility] Visit from 01/22/2022 in [facility] [place] - A Dept Of [facility]. [facility]  PHQ-2 Total Score 4 0 2 2 4   PHQ-9 Total Score 19 -- 10 13 21    Flowsheet Row UC  from 01/22/2024 in [facility] Urgent Care at [facility] from 01/17/2024 in [facility] at [place] UC from 02/10/2023 in [facility] at [facility] RISK CATEGORY No Risk Low Risk No Risk    Assessment and Plan:  Patient start intensive outpatient programming IOP Initiated Abilify  5 mg p.o. nightly  patient enrolled in Intensive Hospitalization Program, patient's current medications are to be continued, the following medications are being prescribed Abilify  5 mg a comprehensive treatment plan will be developed and side effects of medications have been reviewed with patient  Treatment options and alternatives reviewed with patient and patient understands the above plan. Treatment plan was reviewed and agreed upon by NP T.Ezzard and patient Adaline Trejos  need for group services.  Collaboration of Care: Medication Management AEB Abilify  5 mg  Patient/Guardian was advised Release of Information must be obtained prior to any record release in order to collaborate their care with an outside provider. Patient/Guardian was advised if they have not already done so to contact the registration department to sign all necessary forms in order for us  to release information regarding their care.   Consent: Patient/Guardian gives verbal consent for treatment and assignment of  benefits for services provided during this visit. Patient/Guardian expressed understanding and agreed to proceed.   Staci LOISE Ezzard, NP 7/9/20257:30 PM

## 2024-02-02 NOTE — Progress Notes (Signed)
 Virtual Visit via Video Note   I connected with Georgenia Salim on 02/02/24 at  9:00 AM EDT by a video enabled telemedicine application and verified that I am speaking with the correct person using two identifiers.   At orientation to the IOP program, Case Manager discussed the limitations of evaluation and management by telemedicine and the availability of in person appointments. The patient expressed understanding and agreed to proceed with virtual visits throughout the duration of the program.   Location:  Patient: Patient Home Provider: Home Office   History of Present Illness: MDD and GAD   Observations/Objective: Check In: Case Manager checked in with all participants to review discharge dates, insurance authorizations, work-related documents and needs from the treatment team regarding medications. Peggi stated needs and engaged in discussion.    Initial Therapeutic Activity: Counselor facilitated a check-in with Eren to assess for safety, sobriety and medication compliance.  Counselor also inquired about Anne's current emotional ratings, as well as any significant changes in thoughts, feelings or behavior since previous check in.  Roger presented for session on time and was alert, oriented x5, with no evidence or self-report of active SI/HI or A/V H.  Taima reported compliance with medication and denied use of alcohol or illicit substances.  Jennah reported scores of 8/10 for depression, 8/10 for anxiety, and 5/10 for irritability.  Leilani denied any recent outbursts or panic attacks.  Laurelai reported that a struggle has been dealing with anxiety and depression, and trying to figure out what triggered it.  Terrion reported that she also stopped taking her medication several months ago because she didn't feel like it was working.     Second Therapeutic Activity: Counselor utilized a Cabin crew with group members today to guide discussion on topic of codependency.   This handout defined codependency as excessive emotional or psychological reliance upon someone who requires support on account of an illness or addiction.  It also explained how this issue presents in dysfunctional family systems, including behavior such as denying existence of problems, rigid boundaries on communication, strained trust, lack of individuality, and reinforcement of unhealthy coping mechanisms such as substance use.  Characteristics of co-dependent people were listed for assistance with identification, such as extreme need for approval/recognition, difficulty identifying feelings, poor communication, and more.  Members were also tasked with completing a questionnaire in order to identify signs of codependency and results were discussed afterward.  This handout also offered strategies for resolving co-dependency within one's network, including increased use of assertive communication skills in order to set appropriate boundaries.  Intervention was effective, as evidenced by [name] actively participating in discussion on the subject, and completing codependency questionnaire, with 7 out of 20 positive responses.  Vickye reported that she grew up in a dysfunctional family and stated "As I've gotten older, I've realized that for some reason, things I thought I was [place] with, they resurface and cause anger I thought was gone".  She reported that she was the caregiver for everyone else growing up, and over time this has exhausted her, and she realizes that she can no longer maintain this pattern.  Cori stated "Everybody still expects me to do it, but I can't do it mentally anymore". She reported that her goal will be to start setting firmer boundaries with her support network in order to avoid being taken advantage of, and say "No" to unreasonable demands.   Assessment and Plan: Counselor recommends that Nahjae remain in IOP treatment to better manage mental health symptoms,  ensure stability  and pursue completion of treatment plan goals. Counselor recommends adherence to crisis/safety plan, taking medications as prescribed, and following up with medical professionals if any issues arise.    Follow Up Instructions: Counselor will send [employer] Teams link for session tomorrow.  Jaslene was advised to call back or seek an in-person evaluation if the symptoms worsen or if the condition fails to improve as anticipated.   Collaboration of Care:   Medication Management AEB Staci Kerns, NP                                           Case Manager AEB [name], CNA    Patient/Guardian was advised Release of Information must be obtained prior to any record release in order to collaborate their care with an outside provider. Patient/Guardian was advised if they have not already done so to contact the registration department to sign all necessary forms in order for us  to release information regarding their care.    Consent: Patient/Guardian gives verbal consent for treatment and assignment of benefits for services provided during this visit. Patient/Guardian expressed understanding and agreed to proceed.   I provided 180 minutes of non-face-to-face time during this encounter.   Darleene Ricker, LCSW, LCAS 02/02/24

## 2024-02-02 NOTE — Progress Notes (Signed)
 Virtual Visit via Video Note  I connected with Jamie Lee on @TODAY @ at  9:00 AM EDT by a video enabled telemedicine application and verified that I am speaking with the correct person using two identifiers.  Location: Patient: at home Provider: at office   I discussed the limitations of evaluation and management by telemedicine and the availability of in person appointments. The patient expressed understanding and agreed to proceed.    I discussed the assessment and treatment plan with the patient. The patient was provided an opportunity to ask questions and all were answered. The patient agreed with the plan and demonstrated an understanding of the instructions.   The patient was advised to call back or seek an in-person evaluation if the symptoms worsen or if the condition fails to improve as anticipated.  I provided 30 minutes of non-face-to-face time during this encounter.   Jamie Lee, M.Ed,CNA   Patient ID: Jamie Lee, female   DOB: 13-Mar-1981, 43 y.o.   MRN: [id number] D:  As per previous CCA states:  Jamie Lee is a 43yo, [place] American female, self-referred, with past psych hx of MDD and GAD, presenting for intake CCA to re-establish therapy services for support in the management of increased depressive and anxious sxs across settings. Patient reports having previously received OPT with Jamie Hussami, LCSW with [facility], and med man via her PCP, having been prescribed Lexapro  and Buspirone , however discontinuing therapy after approximately 4 months, and having not taken medications consistently due to feeling ineffective and negative side effects.  Pt reports of decided to re-establish therapy due to worsening depressive and anxious sxs over recent months. Pt reports stressors to include relationship with spouse, relationships with sons, grief surrounding loss of mother, estranged relationships with siblings, worsening diabetes and physical health concerns, work  related stress, and the management of presenting MH sxs. Sxs include isolative behaviors, increased irritability, anger/aggression towards others, poor sleep, weight gain, poor eating habits/comfort eating, decreased energy, hopelessness, worthlessness, tearfulness, difficulties concentrating, restlessness, racing thoughts, and anhedonia. Pt refused to provide direct responses to SI screenings, proving to endorse pSI, denying current desire to die, denying plan, intent, or means, denying HI, AVH. Pt denies substance use concerns, sharing of drinking approx. 1 bottle across the span of 1 week. Pt denies any other prior hx outside of previously noted OPT. Pt expressed potential consideration of MHIOP services in efforts to greater support pt in managing increased sxs, with plan to transition to OPT upon completion of IOP program, in conjunction with continued medication management.  Pt was referred to virtual MH-IOP per Jamie Maris, LCSW on 01-17-24.  The case manager had reached out to pt on that day and then again on 01-20-24; but pt never returned the call re: MH-IOP until yesterday.  According to pt, she had been seeing another therapist the past couple of weeks.  The therapist isn't doing anything for me.  I am getting nothing.  Nothing is working, so I need to try something else.  Pt also mentioned that her short term disability would be ending on 02-07-24.  Pt rates both her depression and anxiety at a 8 (10 being the worst).  Denies SI/HI or A/V hallucinations.  Admits to hx of stopping medication d/t feeling like it wasn't working. A:  Oriented pt.  Pt was advised of ROI must be obtained prior to any records release in order to collaborate her care with an outside provider.  Pt was advised if she has not already  done so to contact the front desk to sign all necessary forms in order for MH-IOP to release info re: her care.  Consent:  Pt gives verbal consent for tx and assignment of benefits for  services provided during this telehealth group process.  Pt expressed understanding and agreed to proceed. Collaboration of care:  Collaborate with Jamie Kerns, NP AEB, Jamie Maris, LCSW AEB; Jamie Ricker, LCSW AEB. Encouraged support groups through [employer] and [employer].   Pt will improve her mood as evidenced by being happy again, managing her mood and coping with daily stressors for 5 out of 7 days for 60 days.  R:  Pt receptive.   Jamie Lee, M.Ed,CNA

## 2024-02-03 ENCOUNTER — Telehealth (HOSPITAL_COMMUNITY): Payer: Self-pay | Admitting: Psychiatry

## 2024-02-03 ENCOUNTER — Other Ambulatory Visit (HOSPITAL_COMMUNITY): Admitting: Psychiatry

## 2024-02-03 ENCOUNTER — Telehealth: Payer: Self-pay | Admitting: Internal Medicine

## 2024-02-03 DIAGNOSIS — F331 Major depressive disorder, recurrent, moderate: Secondary | ICD-10-CM | POA: Diagnosis not present

## 2024-02-03 DIAGNOSIS — F411 Generalized anxiety disorder: Secondary | ICD-10-CM

## 2024-02-03 NOTE — Telephone Encounter (Signed)
 D:  Placed second call to patient in an attempt to complete the scales (ie. Nutrition, Pain and PHQ-9), but there was no answer and the answering machine didn't come on.  A:  Will attempt again tomorrow.  Inform the treatment team.

## 2024-02-03 NOTE — Telephone Encounter (Signed)
-----   Message from Barnie Louder sent at 02/03/2024  8:49 AM EDT ----- Regarding: appt Over due for f/u visit for chronic ds management

## 2024-02-03 NOTE — Progress Notes (Signed)
 Virtual Visit via Video Note   I connected with [name] on 02/03/24 at  9:00 AM EDT by a video enabled telemedicine application and verified that I am speaking with the correct person using two identifiers.   At orientation to the IOP program, Case Manager discussed the limitations of evaluation and management by telemedicine and the availability of in person appointments. The patient expressed understanding and agreed to proceed with virtual visits throughout the duration of the program.   Location:  Patient: Patient Home Provider: [facility]   History of Present Illness: GAD and MDD   Observations/Objective: Check In: Case Manager checked in with all participants to review discharge dates, insurance authorizations, work-related documents and needs from the treatment team regarding medications. Quanda stated needs and engaged in discussion.    Initial Therapeutic Activity: Counselor facilitated a check-in with Atleigh to assess for safety, sobriety and medication compliance.  Counselor also inquired about Zakiah's current emotional ratings, as well as any significant changes in thoughts, feelings or behavior since previous check in.  Ilah presented for session on time and was alert, oriented x5, with no evidence or self-report of active SI/HI or A/V H.  Essa reported compliance with medication and denied use of alcohol or illicit substances.  Nyree reported scores of 5/10 for depression, 8/10 for anxiety, and 8/10 for anger/irritability.  Minta denied any recent panic attacks.  Allesha reported that a struggle was being triggered yesterday when talked to her cousin about a difficult subject, and having an outburst.  Teddy reported that a success was reflecting upon the material that we covered in group yesterday and deciding to set a firmer boundary with this family member to protect her own mental health.  Julie reported that her goal today is to go to the park  to get out of the house.    Second Therapeutic Activity: Counselor invited members to participate in peaceful place guided imagery activity today.  Counselor explained how this is a powerful visualization tool which can aid in reducing stress while increasing sense of calm, control, and awareness if practiced regularly.  Counselor informed members beforehand that if they became uncomfortable at any point during activity, they could stop and open their eyes.  Counselor invited members to get comfortable, achieve a relaxing breathing rhythm, close their eyes, and then guided them through process of creating a 'peaceful place' which filled them with safety and calm.  Counselor encouraged members to include sensory details involving vision, sound, touch, smell, and taste which they considered pleasant to enhance experience.  After practicing in session, counselor invited members to share their opinion on the activity, including whether they were able to imagine a specific place, what details stood out to them, and how this made them feel during and after.  Intervention was effective, as evidenced by [name] successfully participating in activity, and reporting that she was able to imagine an empty, quiet blue void with white spots.  She reported that this space felt soft like pillows, and there was no one there to bother her.  She reported that she felt better as a result of this practice, and would try to do it on her own for self-care when stressed, and could even help her go to sleep at night.  Alandria reported that it also lowered her anger/irritability down to 4/10 in severity.      Assessment and Plan: Counselor recommends that Darcella remain in IOP treatment to better manage mental health symptoms, ensure stability and  pursue completion of treatment plan goals. Counselor recommends adherence to crisis/safety plan, taking medications as prescribed, and following up with medical professionals if any issues  arise.    Follow Up Instructions: Counselor will send [employer] Teams link for session tomorrow.  Fionna was advised to call back or seek an in-person evaluation if the symptoms worsen or if the condition fails to improve as anticipated.   Collaboration of Care:   Medication Management AEB Staci Kerns, NP                                           Case Manager AEB [name], CNA    Patient/Guardian was advised Release of Information must be obtained prior to any record release in order to collaborate their care with an outside provider. Patient/Guardian was advised if they have not already done so to contact the registration department to sign all necessary forms in order for us  to release information regarding their care.    Consent: Patient/Guardian gives verbal consent for treatment and assignment of benefits for services provided during this visit. Patient/Guardian expressed understanding and agreed to proceed.   I provided 180 minutes of non-face-to-face time during this encounter.   Darleene Ricker, LCSW, LCAS 02/03/24

## 2024-02-03 NOTE — Telephone Encounter (Signed)
 Called patient, unable to reach patient. Left VM for call back.

## 2024-02-04 ENCOUNTER — Other Ambulatory Visit (HOSPITAL_COMMUNITY): Admitting: Psychiatry

## 2024-02-04 DIAGNOSIS — F331 Major depressive disorder, recurrent, moderate: Secondary | ICD-10-CM

## 2024-02-04 DIAGNOSIS — F411 Generalized anxiety disorder: Secondary | ICD-10-CM

## 2024-02-04 NOTE — Progress Notes (Signed)
 Virtual Visit via Video Note   I connected with Jamie Lee on 02/04/24 at  9:00 AM EDT by a video enabled telemedicine application and verified that I am speaking with the correct person using two identifiers.   At orientation to the IOP program, Case Manager discussed the limitations of evaluation and management by telemedicine and the availability of in person appointments. The patient expressed understanding and agreed to proceed with virtual visits throughout the duration of the program.   Location:  Patient: Patient Home Provider: [facility]   History of Present Illness: MDD and GAD   Observations/Objective: Check In: Case Manager checked in with all participants to review discharge dates, insurance authorizations, work-related documents and needs from the treatment team regarding medications. Jamie Lee stated needs and engaged in discussion.    Initial Therapeutic Activity: Counselor facilitated a check-in with Jamie Lee to assess for safety, sobriety and medication compliance.  Counselor also inquired about Jamie Lee's current emotional ratings, as well as any significant changes in thoughts, feelings or behavior since previous check in.  Jamie Lee presented for session on time and was alert, oriented x5, with no evidence or self-report of active SI/HI or A/V H.  Jamie Lee reported compliance with medication and denied use of alcohol or illicit substances.  Jamie Lee reported scores of 4/10 for depression, 7/10 for anxiety, and 4/10 for anger/irritability.  Jamie Lee denied any recent outbursts or panic attacks.  Jamie Lee reported that a struggle was dealing with rainy weather, which led to a change of plans yesterday.  Jamie Lee reported that a success was going to a threading appointment for self-care.     Second Therapeutic Activity: Counselor provided demonstration of relaxation technique known as mindful breathing meditation to help members increase sense of calm, resiliency, and  control.  Counselor guided members through process of getting comfortable, achieving a relaxed breathing rhythm, and focusing on this for several minutes, allowing troubling thoughts and feelings to come and go without rumination.  Counselor processed effectiveness of activity afterward in discussion with members, including how this impacted their mental state, whether it was difficult to stay focused, and if they plan to include it in self-care routine to improve day-to-day coping.  Intervention was ineffective, as evidenced by Jamie Lee participating in exercise, but reporting that she finds guided imagery to be more helpful.  Jamie Lee reported that noises in her home kept distracting her, and made it difficult to focus.     Third Therapeutic Activity: Psycho-educational portion of group was provided by Lorane Rochester, Interior and spatial designer of community education with [employer].  Jamie Lee provided information on history of her local agency, mission statement, and the variety of unique services offered which group members might find beneficial to engage in, including both virtual and in-person support groups, as well as peer support program for mentoring.  Jamie Lee offered time to answer member's questions regarding services and encouraged them to consider utilizing these services to assist in working towards their individual wellness goals.  Intervention effectiveness could not be measured, as client did not participate.    Assessment and Plan: Counselor recommends that Jamie Lee remain in IOP treatment to better manage mental health symptoms, ensure stability and pursue completion of treatment plan goals. Counselor recommends adherence to crisis/safety plan, taking medications as prescribed, and following up with medical professionals if any issues arise.    Follow Up Instructions: Counselor will send [employer] Teams link for session tomorrow.  Jamie Lee was advised to call back or seek an in-person  evaluation if the symptoms worsen or  if the condition fails to improve as anticipated.   Collaboration of Care:   Medication Management AEB Staci Kerns, NP                                           Case Manager AEB [name], CNA    Patient/Guardian was advised Release of Information must be obtained prior to any record release in order to collaborate their care with an outside provider. Patient/Guardian was advised if they have not already done so to contact the registration department to sign all necessary forms in order for us  to release information regarding their care.    Consent: Patient/Guardian gives verbal consent for treatment and assignment of benefits for services provided during this visit. Patient/Guardian expressed understanding and agreed to proceed.   I provided 180 minutes of non-face-to-face time during this encounter.   Darleene Ricker, LCSW, LCAS 02/04/24

## 2024-02-07 ENCOUNTER — Other Ambulatory Visit (HOSPITAL_COMMUNITY): Admitting: Psychiatry

## 2024-02-07 DIAGNOSIS — F0630 Mood disorder due to known physiological condition, unspecified: Secondary | ICD-10-CM

## 2024-02-07 DIAGNOSIS — F331 Major depressive disorder, recurrent, moderate: Secondary | ICD-10-CM | POA: Diagnosis not present

## 2024-02-07 DIAGNOSIS — F411 Generalized anxiety disorder: Secondary | ICD-10-CM

## 2024-02-07 NOTE — Progress Notes (Signed)
 Virtual Visit via Video Note  I connected with Jamie Lee on 02/07/24 at  9:00 AM EDT by a video enabled telemedicine application and verified that I am speaking with the correct person using two identifiers.  Location: Patient: Home Provider: Office   I discussed the limitations of evaluation and management by telemedicine and the availability of in person appointments. The patient expressed understanding and agreed to proceed.   I discussed the assessment and treatment plan with the patient. The patient was provided an opportunity to ask questions and all were answered. The patient agreed with the plan and demonstrated an understanding of the instructions.   The patient was advised to call back or seek an in-person evaluation if the symptoms worsen or if the condition fails to improve as anticipated.  I provided 15 minutes of non-face-to-face time during this encounter.   Jamie LOISE Kerns, NP   [facility] MD/PA/NP OP Progress Note  02/07/2024 9:35 AM Jamie Lee  MRN:  [id number]  Chief Complaint: Medication management HPI:  Jamie Lee 43 year old African-American female was seen and evaluated for follow-up due to initiation of Abilify  on 7/9.  She reports she has been taking and tolerating medications well.  States she noticed overall improvement in her mood.  States she is feeling motivated to complete simple tasks.  States she has not cried in the past 2 days for previous she was experiencing mood lability.  She does have concerns related to sleep disturbance however she is attributing symptoms to increased anxiety states I know why my anxiety is high she reports plans to follow-up with her primary care provider as she does not have a psychiatric provider at this time.  She reports considerations for establishing care with a new provider due to current providers availability.  States her anxiety is increased and her depression has decreased.  No concerns related to suicidal or  homicidal ideations.  Denies auditory or visual hallucinations.  Does report some GI symptoms however states she is recently restarted glipizide which could attribute to decreased appetite.  No other concerns noted at this visit.  Patient to continue intensive outpatient programming.  Visit Diagnosis:    ICD-10-CM   1. MDD (major depressive disorder), recurrent episode, moderate (HCC)  F33.1     2. Mood disorder in conditions classified elsewhere  F06.30       Past Psychiatric History:   Past Medical History:  Past Medical History:  Diagnosis Date   Arthritis    Diabetes mellitus without complication (HCC)    Schnyder's crystalline corneal dystrophy    Sciatica    Vision abnormalities     Past Surgical History:  Procedure Laterality Date   CHOLECYSTECTOMY     EYE SURGERY Bilateral     Family Psychiatric History:   Family History:  Family History  Problem Relation Age of Onset   Diabetes Maternal Grandfather    Blindness Maternal Grandfather    Heart disease Other    Hypertension Other    Cancer Other    Breast cancer Neg Hx     Social History:  Social History   Socioeconomic History   Marital status: Married    Spouse name: Not on file   Number of children: Not on file   Years of education: Not on file   Highest education level: Not on file  Occupational History   Not on file  Tobacco Use   Smoking status: Never   Smokeless tobacco: Never  Vaping Use   Vaping status: Never  Used  Substance and Sexual Activity   Alcohol use: Yes    Comment: occ   Drug use: No   Sexual activity: Not Currently    Birth control/protection: Pill  Other Topics Concern   Not on file  Social History Narrative   Not on file   Social Drivers of Health   Financial Resource Strain: Not on file  Food Insecurity: Not on file  Transportation Needs: Not on file  Physical Activity: Not on file  Stress: Not on file  Social Connections: Not on file    Allergies:  Allergies   Allergen Reactions   Metformin And Related Diarrhea    Metabolic Disorder Labs: Lab Results  Component Value Date   HGBA1C 9.3 (H) 02/03/2023   No results found for: PROLACTIN Lab Results  Component Value Date   CHOL 160 02/03/2023   TRIG 77 02/03/2023   HDL 58 02/03/2023   CHOLHDL 2.8 02/03/2023   LDLCALC 87 02/03/2023   LDLCALC 88 01/22/2022   No results found for: TSH  Therapeutic Level Labs: No results found for: LITHIUM No results found for: VALPROATE No results found for: CBMZ  Current Medications: Current Outpatient Medications  Medication Sig Dispense Refill   Accu-Chek Softclix Lancets lancets Use as instructed 100 each 12   ARIPiprazole  (ABILIFY ) 5 MG tablet Take 1 tablet (5 mg total) by mouth daily. 30 tablet 0   Blood Glucose Monitoring Suppl (ACCU-CHEK GUIDE) w/Device KIT UAD 1 kit 0   busPIRone  (BUSPAR ) 7.5 MG tablet TAKE 1 TABLET(7.5 MG) BY MOUTH THREE TIMES DAILY 270 tablet 0   Continuous Blood Gluc Receiver (FREESTYLE LIBRE 3 READER) DEVI 1 Device by Does not apply route daily. ICD E11.69, E66.01 1 each 0   Continuous Glucose Sensor (FREESTYLE LIBRE 3 PLUS SENSOR) MISC Use to check blood sugar continuously throughout the day. Change sensor every 15 days. E11.69 2 each 6   diclofenac  sodium (VOLTAREN ) 1 % GEL Apply 2 g topically 4 (four) times daily. 100 g 1   Dulaglutide  (TRULICITY ) 1.5 MG/0.5ML SOAJ Inject 1.5 mg into the skin once a week. Must have office visit for refills 2 mL 0   escitalopram  (LEXAPRO ) 20 MG tablet TAKE 1 TABLET(20 MG) BY MOUTH DAILY 90 tablet 1   glimepiride  (AMARYL ) 4 MG tablet Take 1 tablet (4 mg total) by mouth daily with breakfast. Please schedule appointment w/ Dr. Vicci for more refills. 30 tablet 0   glucose blood (ACCU-CHEK GUIDE) test strip Use as instructed 100 each 12   JUNEL FE 1.5/30 1.5-30 MG-MCG tablet Take 1 tablet by mouth daily.     predniSONE  (STERAPRED UNI-PAK 21 TAB) 10 MG (21) TBPK tablet Take as  directed 21 tablet 0   No current facility-administered medications for this visit.     Musculoskeletal: Virtual visit Psychiatric Specialty Exam: Review of Systems  Last menstrual period 12/26/2023.There is no height or weight on file to calculate BMI.  General Appearance: NA  Eye Contact:  NA  Speech:  Clear and Coherent  Volume:  Normal  Mood:  Euthymic  Affect:  Congruent  Thought Process:  Coherent  Orientation:  Full (Time, Place, and Person)  Thought Content: Logical   Suicidal Thoughts:  No  Homicidal Thoughts:  No  Memory:  Immediate;   Good Recent;   Fair  Judgement:  Good  Insight:  Fair  Psychomotor Activity:  Normal  Concentration:  Concentration: Fair  Recall:  Good  Fund of Knowledge: Good  Language: Good  Akathisia:  NA  Handed:  Right  AIMS (if indicated): not done  Assets:  Communication Skills Desire for Improvement  ADL's:  Intact  Cognition: WNL  Sleep:  Fair   Screenings: GAD-7    Advertising copywriter from 01/17/2024 in [facility] at [facility] Visit from 10/13/2022 in [facility] [place] - A Dept Of [place] Counselor from 03/11/2022 in [facility] at [facility] [place] Visit from 01/22/2022 in [facility] - A Dept Of [place] [facility]. [facility] Office Visit from 06/27/2018 in [facility] at [facility] [facility]  Total GAD-7 Score 21 7 18 21 20    PHQ2-9    Flowsheet Row Counselor from 02/04/2024 in BEHAVIORAL HEALTH INTENSIVE PSYCH Counselor from 01/17/2024 in [facility] Outpatient Behavioral Health at [facility] Nutrition from 11/25/2022 in [facility]  - A Dept Of [place]. [facility] Office Visit from 10/13/2022 in [facility] [place] - A Dept Of [place] [facility]. [facility] Counselor from 03/11/2022 in [facility] Outpatient Behavioral Health at [facility] Total Score 4 4 0  2 2  PHQ-9 Total Score 19 19 -- 10 13   Flowsheet Row Counselor from 02/04/2024 in BEHAVIORAL HEALTH INTENSIVE PSYCH UC from 01/22/2024 in [facility] Urgent Care at [facility] from 01/17/2024 in [facility] Out[facility] Health at [facility] RISK CATEGORY Error: Question 6 not populated No Risk Low Risk     Assessment and Plan:  Continue Intensive outpatient programming- IOP Continue medications as directed  Collaboration of Care: Collaboration of Care: Medication Management AEB Abilify  5 mg daily  Patient/Guardian was advised Release of Information must be obtained prior to any record release in order to collaborate their care with an outside provider. Patient/Guardian was advised if they have not already done so to contact the registration department to sign all necessary forms in order for us  to release information regarding their care.   Consent: Patient/Guardian gives verbal consent for treatment and assignment of benefits for services provided during this visit. Patient/Guardian expressed understanding and agreed to proceed.    Jamie LOISE Kerns, NP 02/07/2024, 9:35 AM

## 2024-02-07 NOTE — Progress Notes (Signed)
 Virtual Visit via Video Note   I connected with Charlei Ramsaran on 02/07/24 at  9:00 AM EDT by a video enabled telemedicine application and verified that I am speaking with the correct person using two identifiers.   At orientation to the IOP program, Case Manager discussed the limitations of evaluation and management by telemedicine and the availability of in person appointments. The patient expressed understanding and agreed to proceed with virtual visits throughout the duration of the program.   Location:  Patient: Patient Home Provider: Home Office   History of Present Illness: MDD and GAD   Observations/Objective: Check In: Case Manager checked in with all participants to review discharge dates, insurance authorizations, work-related documents and needs from the treatment team regarding medications. Dulce stated needs and engaged in discussion.    Initial Therapeutic Activity: Counselor facilitated a check-in with Hyla to assess for safety, sobriety and medication compliance.  Counselor also inquired about Nga's current emotional ratings, as well as any significant changes in thoughts, feelings or behavior since previous check in.  Anndee presented for session on time and was alert, oriented x5, with no evidence or self-report of active SI/HI or A/V H.  Sherlonda reported compliance with medication and denied use of alcohol or illicit substances.  Aarya reported scores of 4/10 for depression, 8/10 for anxiety, and 7/10 for irritability.  Roben denied any recent outbursts or panic attacks.  Jenavee reported that a struggle was feleing frustrated when her son had friends visit yesterday and eat food without asking.  Kaci reported that a success was feeling productive about her weekend, stating "I got a lot of yardwork done".  Brooklynn reported that her goal today is to do more yardwork if the weather is fair.         Second Therapeutic Activity: Counselor introduced Amanda  Davee Lomax, [facility] Chaplain to provide psychoeducation on topic of Grief and Loss with members today.  Alan began discussion by checking in with the group about their baseline mood today, general thoughts on what grief means to them and how it has affected them personally in the past.  Alan provided information on how the process of grief/loss can differ depending upon one's unique culture, and categories of loss one could experience (i.e. loss of a person, animal, relationship, job, identity, etc).  Alan encouraged members to be mindful of how pervasive loss can be, and how to recognize signs which could indicate that this is having an impact on one's overall mental health and wellbeing.  Intervention was effective, as evidenced by [place] participating in discussion with speaker on the subject, reporting that this led her to think on the difficult relationship she had with her mother.  Malashia reported that she questioned some of her mother's decisions growing up, but came to understand after her mother passed, and Jeneva eventually started to experience similar parenting challenges.  Kathelyn reported that she has also grieved loss of normalcy due to depression and anxiety, stating "I miss who I used to be, and how I used to look.  I had to learn that grief isn't just losing someone.  It could be a divorce, or even your kid going astray".  Airanna was receptive to supportive feedback from chaplain.      Third Therapeutic Activity: Counselor discussed topic of sleep hygiene today with group.  Counselor defined this as the habits, behaviors and environmental factors that can be adjusted to improve overall sleep quality.  Counselor also discussed how lack of consistent sleep  can negatively affect mood and overall mental health.  Counselor provided members with a screening to complete assessing typical barriers one might face in achieving quality sleep at night (i.e. struggling to get up in the morning,  waking up throughout the night, tossing/turning, etc), and inquired about members' perception of sleep hygiene at present.  Counselor offered techniques to members via a virtual handout which could be implemented in order to improve sleep hygiene, such as sticking to a normal morning/night routine, avoiding using of electronics too close to bedtime, avoiding heavy meals before bed, using a sleep journal to track changes and address anxious thoughts, as well as avoiding naps during the daytime, ensuring proper use of medications if prescribed any by provider(s), and more.  Counselor inquired about changes members intend to make to sleep hygiene based upon information covered today.  Interventions were effective, as evidenced by [name] actively participating in discussion on subject, reporting that she usually averages 6 hours of sleep at night, and doesn't wake up feeling well rested the next morning.  She reported that she gets woken up frequently, and relies on coffee most mornings.  Anhthu reported that she plans to improve sleep hygiene over the course of treatment by getting into a regular sleep/wake cycle, avoiding daytime naps, listening to relaxing music at bedtime, reading a book to tire her out when she can't fall asleep, wearing a sleep mask and earplugs to block out noise and light, doing stretches to help with muscle pain or tension, or taking a hot bath at bedtime.      Assessment and Plan: Counselor recommends that Iriel remain in IOP treatment to better manage mental health symptoms, ensure stability and pursue completion of treatment plan goals. Counselor recommends adherence to crisis/safety plan, taking medications as prescribed, and following up with medical professionals if any issues arise.    Follow Up Instructions: Counselor will send [employer] Teams link for session tomorrow.  Michale was advised to call back or seek an in-person evaluation if the symptoms worsen or if the  condition fails to improve as anticipated.   Collaboration of Care:   Medication Management AEB Staci Kerns, NP                                           Case Manager AEB [name], CNA    Patient/Guardian was advised Release of Information must be obtained prior to any record release in order to collaborate their care with an outside provider. Patient/Guardian was advised if they have not already done so to contact the registration department to sign all necessary forms in order for us  to release information regarding their care.    Consent: Patient/Guardian gives verbal consent for treatment and assignment of benefits for services provided during this visit. Patient/Guardian expressed understanding and agreed to proceed.   I provided 180 minutes of non-face-to-face time during this encounter.   Darleene Ricker, LCSW, LCAS 02/07/24

## 2024-02-08 ENCOUNTER — Other Ambulatory Visit (HOSPITAL_COMMUNITY): Admitting: Licensed Clinical Social Worker

## 2024-02-08 DIAGNOSIS — F411 Generalized anxiety disorder: Secondary | ICD-10-CM

## 2024-02-08 DIAGNOSIS — F331 Major depressive disorder, recurrent, moderate: Secondary | ICD-10-CM

## 2024-02-08 NOTE — Progress Notes (Signed)
 Virtual Visit via Video Note   I connected with Evelena Masci on 02/08/24 at  9:00 AM EDT by a video enabled telemedicine application and verified that I am speaking with the correct person using two identifiers.   At orientation to the IOP program, Case Manager discussed the limitations of evaluation and management by telemedicine and the availability of in person appointments. The patient expressed understanding and agreed to proceed with virtual visits throughout the duration of the program.   Location:  Patient: Patient Home Provider: [facility]   History of Present Illness: MDD and GAD   Observations/Objective: Check In: Case Manager checked in with all participants to review discharge dates, insurance authorizations, work-related documents and needs from the treatment team regarding medications. Shakila stated needs and engaged in discussion.    Initial Therapeutic Activity: Counselor facilitated a check-in with Shanyn to assess for safety, sobriety and medication compliance.  Counselor also inquired about Renee's current emotional ratings, as well as any significant changes in thoughts, feelings or behavior since previous check in.  Deshanda presented for session on time and was alert, oriented x5, with no evidence or self-report of active SI/HI or A/V H.  Arly reported compliance with medication and denied use of alcohol or illicit substances.  Jasa reported scores of 8/10 for depression, 8/10 for anxiety, and 7/10 for irritability.  Shaunika denied any recent outbursts or panic attacks.  Tabbitha reported that a struggle was sleeping poorly last night, which greatly affected her mood this morning.  Casha reported that a success was getting laundry done yesterday afternoon.  Salote reported that her goal today is to do some yardwork to distract herself this afternoon.      Second Therapeutic Activity: Counselor covered topic of attachment styles today.  Counselor  virtually shared a handout with the group on this topic which defined attachment styles as how people think about and behave in relationships.  Styles were broken down by category, including secure attachment where one believes close relationships are trustworthy, compared to insecure attachment (i.e. anxious, avoidant, or anxious-avoidant) where one is distrusting or worries about their bond with others.  Counselor inquired about which attachment style members most related to, how this has influenced their mental health/well-being, and whether they intend to begin making any changes.  Intervention was effective, as evidenced by [place] participating in discussion, and reporting that she most identified with the avoidant attachment style due to traits such as becoming overly rigid, guarded and distant, uncomfortable with emotions and conflict, and trouble expressing needs and wants.  Lateisha reported that this has led to consequences such as preventing people from getting close to her, and at times starting conflict to avoid letting her guard down.  Mirenda reported that she plans to use therapy as a safe space to explore her beliefs about relationships and how this impacts her mental health for better or worse.   Quatisha stated "I don't know how to have a secure relationship yet".  Assessment and Plan: Counselor recommends that Blessen remain in IOP treatment to better manage mental health symptoms, ensure stability and pursue completion of treatment plan goals. Counselor recommends adherence to crisis/safety plan, taking medications as prescribed, and following up with medical professionals if any issues arise.    Follow Up Instructions: Counselor will send [employer] Teams link for session tomorrow.  Iza was advised to call back or seek an in-person evaluation if the symptoms worsen or if the condition fails to improve as anticipated.   Collaboration  of Care:   Medication Management AEB Staci Kerns, NP                                           Case Manager AEB [name], CNA    Patient/Guardian was advised Release of Information must be obtained prior to any record release in order to collaborate their care with an outside provider. Patient/Guardian was advised if they have not already done so to contact the registration department to sign all necessary forms in order for us  to release information regarding their care.    Consent: Patient/Guardian gives verbal consent for treatment and assignment of benefits for services provided during this visit. Patient/Guardian expressed understanding and agreed to proceed.   I provided 180 minutes of non-face-to-face time during this encounter.   Darleene Ricker, LCSW, LCAS 02/08/24

## 2024-02-09 ENCOUNTER — Other Ambulatory Visit (HOSPITAL_COMMUNITY): Admitting: Psychiatry

## 2024-02-09 ENCOUNTER — Telehealth (HOSPITAL_COMMUNITY): Payer: Self-pay | Admitting: Psychiatry

## 2024-02-09 DIAGNOSIS — F331 Major depressive disorder, recurrent, moderate: Secondary | ICD-10-CM

## 2024-02-09 DIAGNOSIS — F411 Generalized anxiety disorder: Secondary | ICD-10-CM

## 2024-02-09 NOTE — Telephone Encounter (Signed)
 D:  Placed call to discuss follow up options with pt, but there was no answer.  A:  Left case manager's phone #.  Had scanned and emailed pt lists of psychiatrists and therapists for her to choose from.  Spoke with pt's previous therapist Sydnee Maris, [place]) and he states that pt is no longer under his care since she had started seeing another therapist.  Case Manager will inform pt.  Will strongly recommend pt to start calling around for f/u since pt is considering not returning to her PCP, nor current new therapist.  Case mgr is encouraging pt to see the therapist and PCP at least one more time before ending services with them.
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
## 2024-02-10 ENCOUNTER — Other Ambulatory Visit: Payer: Self-pay | Admitting: Internal Medicine

## 2024-02-10 ENCOUNTER — Other Ambulatory Visit (HOSPITAL_COMMUNITY): Admitting: Psychiatry

## 2024-02-10 DIAGNOSIS — F331 Major depressive disorder, recurrent, moderate: Secondary | ICD-10-CM

## 2024-02-10 DIAGNOSIS — F411 Generalized anxiety disorder: Secondary | ICD-10-CM

## 2024-02-10 NOTE — Progress Notes (Signed)
 Virtual Visit via Video Note   I connected with Jamie Lee on 02/10/24 at  9:00 AM EDT by a video enabled telemedicine application and verified that I am speaking with the correct person using two identifiers.   At orientation to the IOP program, Case Manager discussed the limitations of evaluation and management by telemedicine and the availability of in person appointments. The patient expressed understanding and agreed to proceed with virtual visits throughout the duration of the program.   Location:  Patient: Patient Home Provider: Home Office   History of Present Illness: MDD and GAD   Observations/Objective: Check In: Case Manager checked in with all participants to review discharge dates, insurance authorizations, work-related documents and needs from the treatment team regarding medications. Jamie Lee stated needs and engaged in discussion.    Initial Therapeutic Activity: Counselor facilitated a check-in with Jamie Lee to assess for safety, sobriety and medication compliance.  Counselor also inquired about Jamie Lee's current emotional ratings, as well as any significant changes in thoughts, feelings or behavior since previous check in.  Jamie Lee presented for session on time and was alert, oriented x5, with no evidence or self-report of active SI/HI or A/V H.  Jamie Lee reported compliance with medication and denied abuse of illicit substances.  Jamie Lee reported scores of 4/10 for depression, 6/10 for anxiety, and 4/10 for irritability.  Jamie Lee denied any recent outbursts or panic attacks.  Jamie Lee reported that a struggle has been feeling 'on edge' recently, and having a few glasses of wine yesterday to cope with this.  Counselor encouraged Jamie Lee to be mindful of alcohol use and try other coping skills to avoid dependence.  Jamie Lee reported that her goal today is to work on a puzzle for self-care.          Second Therapeutic Activity: Counselor introduced topic of self-care  today.  Counselor explained how this can be defined as the things one does to maintain good health and improve well-being.  Counselor provided members with a self-care assessment form to complete.  This handout featured various sub-categories of self-care, including physical, psychological/emotional, social, spiritual, and professional.  Members were asked to rank their engagement in the activities listed for each dimension on a scale of 1-3, with 1 indicating 'Poor', 2 indicating 'Ok', and 3 indicating 'Well'.  Counselor invited members to share results of their assessment, and inquired about which areas of self-care they are doing well in, as well as areas that require attention, and how they plan to begin addressing this during treatment.  Intervention was effective, as evidenced by Jamie Lee successfully completing initial 2 sections of assessment and actively engaging in discussion on subject, reporting that she is excelling in areas such as taking care of personal hygiene, eating regularly, taking time off from work and other distractions, recognizing her own strengths and achievements, and doing comforting things in her routine, but would benefit from focusing more on areas such as eating healthy foods, getting enough sleep ,participating in hobbies, getting away from distractions, and expressing feelings in healthy ways.  Jamie Lee reported that she would work to improve self-care deficits by avoid bringing unhealthy options into the house as often to enhance diet, using sleep hygiene techniques from therapy to get more rest at night, exploring new hobbies such as knitting or candle making, setting healthier social media boundaries with her phone, and staying engaged in therapy to ensure a safe outlet to express feelings.    Assessment and Plan: Counselor recommends that Ladene remain in IOP treatment to better  manage mental health symptoms, ensure stability and pursue completion of treatment plan goals.  Counselor recommends adherence to crisis/safety plan, taking medications as prescribed, and following up with medical professionals if any issues arise.    Follow Up Instructions: Counselor will send [employer] Teams link for session tomorrow.  Inita was advised to call back or seek an in-person evaluation if the symptoms worsen or if the condition fails to improve as anticipated.   Collaboration of Care:   Medication Management AEB Staci Kerns, NP                                           Case Manager AEB [name], CNA    Patient/Guardian was advised Release of Information must be obtained prior to any record release in order to collaborate their care with an outside provider. Patient/Guardian was advised if they have not already done so to contact the registration department to sign all necessary forms in order for us  to release information regarding their care.    Consent: Patient/Guardian gives verbal consent for treatment and assignment of benefits for services provided during this visit. Patient/Guardian expressed understanding and agreed to proceed.   I provided 180 minutes of non-face-to-face time during this encounter.   Jamie Ricker, LCSW, LCAS 02/10/24

## 2024-02-11 ENCOUNTER — Other Ambulatory Visit: Payer: Self-pay | Admitting: Internal Medicine

## 2024-02-11 ENCOUNTER — Other Ambulatory Visit (HOSPITAL_COMMUNITY): Admitting: Licensed Clinical Social Worker

## 2024-02-11 DIAGNOSIS — F331 Major depressive disorder, recurrent, moderate: Secondary | ICD-10-CM | POA: Diagnosis not present

## 2024-02-11 DIAGNOSIS — E1165 Type 2 diabetes mellitus with hyperglycemia: Secondary | ICD-10-CM

## 2024-02-11 DIAGNOSIS — F411 Generalized anxiety disorder: Secondary | ICD-10-CM

## 2024-02-11 NOTE — Progress Notes (Signed)
 Virtual Visit via Video Note   I connected with Jamie Lee on 02/11/24 at  9:00 AM EDT by a video enabled telemedicine application and verified that I am speaking with the correct person using two identifiers.   At orientation to the IOP program, Case Manager discussed the limitations of evaluation and management by telemedicine and the availability of in person appointments. The patient expressed understanding and agreed to proceed with virtual visits throughout the duration of the program.   Location:  Patient: Patient Home Provider: Home Office   History of Present Illness: MDD and GAD   Observations/Objective: Check In: Case Manager checked in with all participants to review discharge dates, insurance authorizations, work-related documents and needs from the treatment team regarding medications. Jamie Lee stated needs and engaged in discussion.    Initial Therapeutic Activity: Counselor facilitated a check-in with Jamie Lee to assess for safety, sobriety and medication compliance.  Counselor also inquired about Jamie Lee's current emotional ratings, as well as any significant changes in thoughts, feelings or behavior since previous check in.  Jamie Lee presented for session on time and was alert, oriented x5, with no evidence or self-report of active SI/HI or A/V H.  Jamie Lee reported compliance with medication and denied use of alcohol or illicit substances.  Jamie Lee reported scores of 2/10 for depression, 5/10 for anxiety, and 0/10 for anger/irritability.  Jamie Lee denied any recent outbursts or panic attacks.  Jamie Lee reported that an ongoing struggle has been dealing with one of her son's behavior, stating "I've been annoyed by that".  Jamie Lee reported that a success was doing some cleaning around the home yesterday, stating "I'm in the process of doing a deep clean".  Jamie Lee reported that her goal this weekend is to put locks on her bedroom door to set firmer boundaries on personal  space in the house.            Second Therapeutic Activity: Counselor introduced topic of self-esteem today and defined this as the value an individual places on oneself, based upon assessment of personal worth as a human being and approval/disapproval of one's behavior. Counselor asked members to assess their level of self-esteem at this time based upon common indicators of high self-esteem, including: accepting oneself unconditionally;  having self-respect and deep seated belief that one matters; being unaffected by other people's opinions/criticisms; and showing good control over emotions.  Counselor also explained concept of one's inner critic which serves to highlight faults and minimize strengths, directly influencing low sense of self-esteem.  Counselor then provided handout on 'strengths and qualities', which featured questions to guide discussion and increase awareness of each member's unique individual abilities which could reinforce higher self-esteem. Examples of questions included: 'things I am good at', 'challenges I have overcome', and 'what I like about myself'.  Intervention was effective, as evidenced by Jamie Lee actively engaging in discussion on topic, and completing a self-esteem assessment, receiving a score of 99, which indicated a 'high' level of self-esteem at this time due to traits such as accepting who she is, not being self-centered, and tolerating greater levels of frustration.  Jamie Lee stated "I feel like I've never had low self-esteem.  There have been some moments where it might have been lower because of life's circumstances".  Jamie Lee was receptive to several strategies offered today for increasing self-esteem during treatment, including prioritizing more self-care activities in her routine like exercise or journaling, giving herself more credit for accomplishments such as earning associates degree, utilizing some of her persona strengths such as honesty  and empathy, and asking  for positive feedback from certain family members like her cousin, while setting boundaries with those that mistreat her.    Assessment and Plan: Counselor recommends that Jamie Lee remain in IOP treatment to better manage mental health symptoms, ensure stability and pursue completion of treatment plan goals. Counselor recommends adherence to crisis/safety plan, taking medications as prescribed, and following up with medical professionals if any issues arise.    Follow Up Instructions: Counselor will send [employer] Teams link for session tomorrow.  Jamie Lee was advised to call back or seek an in-person evaluation if the symptoms worsen or if the condition fails to improve as anticipated.   Collaboration of Care:   Medication Management AEB Staci Kerns, NP                                           Case Manager AEB [name], CNA    Patient/Guardian was advised Release of Information must be obtained prior to any record release in order to collaborate their care with an outside provider. Patient/Guardian was advised if they have not already done so to contact the registration department to sign all necessary forms in order for us  to release information regarding their care.    Consent: Patient/Guardian gives verbal consent for treatment and assignment of benefits for services provided during this visit. Patient/Guardian expressed understanding and agreed to proceed.   I provided 180 minutes of non-face-to-face time during this encounter.   Jamie Ricker, LCSW, LCAS 02/11/24

## 2024-02-14 ENCOUNTER — Other Ambulatory Visit (HOSPITAL_COMMUNITY): Admitting: Psychiatry

## 2024-02-14 ENCOUNTER — Ambulatory Visit: Admitting: Internal Medicine

## 2024-02-14 DIAGNOSIS — F411 Generalized anxiety disorder: Secondary | ICD-10-CM

## 2024-02-14 DIAGNOSIS — F331 Major depressive disorder, recurrent, moderate: Secondary | ICD-10-CM | POA: Diagnosis not present

## 2024-02-14 NOTE — Progress Notes (Signed)
 Virtual Visit via Video Note   I connected with Jamie Lee on 02/14/24 at  9:00 AM EDT by a video enabled telemedicine application and verified that I am speaking with the correct person using two identifiers.   At orientation to the IOP program, Case Manager discussed the limitations of evaluation and management by telemedicine and the availability of in person appointments. The patient expressed understanding and agreed to proceed with virtual visits throughout the duration of the program.   Location:  Patient: Patient Home Provider: [facility] Office   History of Present Illness: MDD and GAD   Observations/Objective: Check In: Case Manager checked in with all participants to review discharge dates, insurance authorizations, work-related documents and needs from the treatment team regarding medications. Jamie Lee stated needs and engaged in discussion.    Initial Therapeutic Activity: Counselor facilitated a check-in with Jamie Lee to assess for safety, sobriety and medication compliance.  Counselor also inquired about Jamie Lee's current emotional ratings, as well as any significant changes in thoughts, feelings or behavior since previous check in.  Jamie Lee presented for session on time and was alert, oriented x5, with no evidence or self-report of active SI/HI or A/V H.  Jamie Lee reported compliance with medication and denied use of alcohol or illicit substances.  Jamie Lee reported scores of 2/10 for depression, 6/10 for anxiety, and 4/10 for irritability.  Jamie Lee denied any recent outbursts or panic attacks.  Jamie Lee reported that a struggle was worrying about her medications becoming ineffective.  She reported that talking to the nurse practitioner about this was helpful.  Jamie Lee reported that a success was getting some tasks done on her to-do list over the weekend. Jamie Lee reported that her goal today is to do some household tasks to stay busy.   Second Therapeutic Activity: Counselor  introduced topic of anger management today.  Counselor virtually shared a handout with members on this subject featuring a variety of coping skills, and facilitated discussion on these approaches.  Examples included raising awareness of anger triggers, practicing deep breathing, keeping an anger log to better understand episodes, using diversion activities to distract oneself for 30 minutes, taking a time out when necessary, and being mindful of warning signs tied to thoughts or behavior.  Counselor inquired about which techniques group members have used before, what has proved to be helpful, what their unique warning signs might be, as well as what they will try out in the future to assist with de-escalation.  Intervention was effective, as evidenced by Jamie Lee participating in discussion on activity, and reporting that she didn't have problems with anger until this past year, stating "I'd never had an outburst until something snapped in the [name].  It was like a ball of anger waiting to explode".   She reported that she also noticed less patience at her job, and was reprimanded for behavior toward others.  Jamie Lee reported that their triggers include being yelled or cursed at, annoyances, confusion, being called names, loud noises, or teased.  Jamie Lee reported that warning signs include blacking out, dissociation, yelling, or dizziness.  Jamie Lee reported that they will work to manage anger more effectively by using coping skills such as assertive communication to express her feelings less aggressively, avoiding use of substances like alcohol to cope with strong feelings like anger, and keeping an anger journal to track outbursts.  Assessment and Plan: Counselor recommends that Jamie Lee remain in IOP treatment to better manage mental health symptoms, ensure stability and pursue completion of treatment plan goals. Counselor recommends adherence  to crisis/safety plan, taking medications as prescribed, and  following up with medical professionals if any issues arise.    Follow Up Instructions: Counselor will send [employer] Teams link for session tomorrow.  Jamie Lee was advised to call back or seek an in-person evaluation if the symptoms worsen or if the condition fails to improve as anticipated.   Collaboration of Care:   Medication Management AEB Staci Kerns, NP                                           Case Manager AEB [name], CNA    Patient/Guardian was advised Release of Information must be obtained prior to any record release in order to collaborate their care with an outside provider. Patient/Guardian was advised if they have not already done so to contact the registration department to sign all necessary forms in order for us  to release information regarding their care.    Consent: Patient/Guardian gives verbal consent for treatment and assignment of benefits for services provided during this visit. Patient/Guardian expressed understanding and agreed to proceed.   I provided 180 minutes of non-face-to-face time during this encounter.   Darleene Ricker, LCSW, LCAS 02/14/24

## 2024-02-15 ENCOUNTER — Other Ambulatory Visit (HOSPITAL_COMMUNITY): Admitting: Psychiatry

## 2024-02-15 DIAGNOSIS — F331 Major depressive disorder, recurrent, moderate: Secondary | ICD-10-CM | POA: Diagnosis not present

## 2024-02-15 DIAGNOSIS — F411 Generalized anxiety disorder: Secondary | ICD-10-CM

## 2024-02-15 NOTE — Patient Instructions (Signed)
 D:  Pt will be discharged on 02-22-24.  The case manager has been attempting to reach pt, in order to complete her discharge since she will be on vacation, but has been unable to talk to patient.  A:  Pt will be discharged as scheduled on 02-22-24.  Pt had previously stated she will have a new patient appointment with a provider on 02-24-24 from 10:30 a.m- 11:30 a.m.SABRA Strongly recommend support groups through [facility] [phone].  Return to work on 02-28-24; without any restrictions.

## 2024-02-15 NOTE — Progress Notes (Signed)
 Virtual Visit via Telephone Note  I connected with Jamie Lee on @TODAY @ at  9:00 AM EDT by telephone and verified that I am speaking with the correct person using two identifiers.  Location: Patient:  at home Provider: at office   I discussed the limitations, risks, security and privacy concerns of performing an evaluation and management service by telephone and the availability of in person appointments. I also discussed with the patient that there may be a patient responsible charge related to this service. The patient expressed understanding and agreed to proceed.   I discussed the assessment and treatment plan with the patient. The patient was provided an opportunity to ask questions and all were answered. The patient agreed with the plan and demonstrated an understanding of the instructions.   The patient was advised to call back or seek an in-person evaluation if the symptoms worsen or if the condition fails to improve as anticipated.  I provided 10 minutes of non-face-to-face time during this encounter.   GRETTA LEVAN, M.Ed,CNA   Patient ID: Jamie Lee, female   DOB: 1980/08/08, 43 y.o.   MRN: [id number] D:  As per previous CCA states:  Jamie Lee is a 43yo, [place] American female, self-referred, with past psych hx of MDD and GAD, presenting for intake CCA to re-establish therapy services for support in the management of increased depressive and anxious sxs across settings. Patient reports having previously received OPT with Christina Hussami, LCSW with [facility], and med man via her PCP, having been prescribed Lexapro  and Buspirone , however discontinuing therapy after approximately 4 months, and having not taken medications consistently due to feeling ineffective and negative side effects.  Pt reports of decided to re-establish therapy due to worsening depressive and anxious sxs over recent months. Pt reports stressors to include relationship with spouse, relationships with  sons, grief surrounding loss of mother, estranged relationships with siblings, worsening diabetes and physical health concerns, work related stress, and the management of presenting MH sxs. Sxs include isolative behaviors, increased irritability, anger/aggression towards others, poor sleep, weight gain, poor eating habits/comfort eating, decreased energy, hopelessness, worthlessness, tearfulness, difficulties concentrating, restlessness, racing thoughts, and anhedonia. Pt refused to provide direct responses to SI screenings, proving to endorse pSI, denying current desire to die, denying plan, intent, or means, denying HI, AVH. Pt denies substance use concerns, sharing of drinking approx. 1 bottle across the span of 1 week. Pt denies any other prior hx outside of previously noted OPT. Pt expressed potential consideration of MHIOP services in efforts to greater support pt in managing increased sxs, with plan to transition to OPT upon completion of IOP program, in conjunction with continued medication management.   Pt was referred to virtual MH-IOP per Lynwood Maris, LCSW on 01-17-24.  The case manager had reached out to pt on that day and then again on 01-20-24; but pt never returned the call re: MH-IOP until yesterday.  According to pt, she had been seeing another therapist the past couple of weeks.  The therapist isn't doing anything for me.  I am getting nothing.  Nothing is working, so I need to try something else.  Pt also mentioned that her short term disability would be ending on 02-07-24.  Pt rates both her depression and anxiety at a 8 (10 being the worst).  Denies SI/HI or A/V hallucinations.  Admits to hx of stopping medication d/t feeling like it wasn't working.  So far pt has attended #10 days in virtual MH-IOP.  Pt is  scheduled to discharge on 02-22-24; but pt's case manager will be on PAL.  Case manager has attempted to reach patient through out the morning and just now; still she will not answer  the phone, nor has she returned the phone call.  Case manager will go ahead and complete her d/c on this end.   Pt continues to worry about her medication's efficacy.  It appears that pt is looking for a reason to stop taking the medication.  Apparently, pt had informed the pharmacist (Dr. Buel Cedar) that she had stopped taking the medication; but admitted to Jamie Kerns, NP that she is still compliant. Pt had mentioned in check in with case mgr that she was able to get an appointment with a psychiatrist/prescriber.  Pt couldn't remember the provider's name; but will be seeing the person on 02-24-24 from 10:30 a.m. - 11:30 a.m.SABRA  Case manager had asked pt to send her an email or call her with the provider's name; but cm has yet to hear from pt.  A:  Will d/c as scheduled on 02-22-24.  F/U with the provider on 02-24-24.  Strongly encouraged support groups through [facility] or [facility] [employer].  RTW on 02-28-24; without any restrictions.  Pt was advised of ROI must be obtained prior to any records release in order to collaborate her care with an outside provider.  Pt was advised if she has not already done so to contact the front desk to sign all necessary forms in order for MH-IOP to release info re: her care.  Consent:  Pt gives verbal consent for tx and assignment of benefits for services provided during this telehealth group process.  Pt expressed understanding and agreed to proceed. Collaboration of care:  Collaborate with Jamie Kerns, NP AEB, Darleene Ricker, LCSW AEB R:  Pt receptive.  [name], M.Ed,CNA

## 2024-02-15 NOTE — Progress Notes (Signed)
 Virtual Visit via Video Note   I connected with Jamie Lee on 02/15/24 at  9:00 AM EDT by a video enabled telemedicine application and verified that I am speaking with the correct person using two identifiers.   At orientation to the IOP program, Case Manager discussed the limitations of evaluation and management by telemedicine and the availability of in person appointments. The patient expressed understanding and agreed to proceed with virtual visits throughout the duration of the program.   Location:  Patient: Patient Home Provider: Home Office    History of Present Illness: MDD and GAD   Observations/Objective: Check In: Case Manager checked in with all participants to review discharge dates, insurance authorizations, work-related documents and needs from the treatment team regarding medications. Jamie Lee stated needs and engaged in discussion.    Initial Therapeutic Activity: Counselor facilitated a check-in with Jamie Lee to assess for safety, sobriety and medication compliance.  Counselor also inquired about Jamie Lee's current emotional ratings, as well as any significant changes in thoughts, feelings or behavior since previous check in.  Jamie Lee presented for session on time and was alert, oriented x5, with no evidence or self-report of active SI/HI or A/V H.  Jamie Lee reported compliance with medication and denied use of alcohol or illicit substances.  Jamie Lee reported scores of 4/10 for depression, 6/10 for anxiety, and 5/10 for anger/irritability.  Jamie Lee denied any recent outbursts or panic attacks.  Jamie Lee reported that a struggle was having a disagreement yesterday with her husband over some work that needs to be done in the home.  Jamie Lee reported that a success was making progress installing a doorknob on her bedroom.  Jamie Lee reported that her goal today is to be proactive and do some cleaning to prepare for a guest that will be visiting this afternoon.    Second  Therapeutic Activity: Counselor engaged the group in discussion on managing work/life balance today to improve mental health and wellness.  Counselor explained how finding balance between responsibilities at home and work place can be challenging, and lead to increased stress.  Counselor facilitated discussion on what challenges members are currently, or have historically faced.  Counselor also discussed strategies for improving work/life balance while members work on their mental health during treatment.  Some of these included keeping track of time management; creating a list of priorities and scaling importance; setting realistic, measurable goals each day; establishing boundaries; taking care of health needs; and nurturing relationships at home and work for support.  Counselor inquired about areas where members feel they are excelling, as well as areas they could focus on during treatment. Intervention was effective, as evidenced by Jamie Lee actively participating in discussion on topic and reporting that she would benefit from working on her work life balance because it can be difficult to take care of both personal and work related tasks in the same day, stating "There just isn't enough time in the day".  Jamie Lee reported that she experienced several symptoms of burnout, including outbursts, feelings of detachment, feeling exhausted and overwhelmed, and changing sleep habits.   Jamie Lee reported that there have also been numerous warning signs such as dreading going into work the next day, spending an inordinate amount of time thinking about work off the clock, feeling that she has no time for activities besides work, and getting addressed by management for her tone toward clients.  Jamie Lee was receptive to suggestions offered today for addressing work life imbalance, including keeping an activities time log for a week, making more time  to journal about her feelings to avoid outbursts at work, and creating  daily to do lists to get more organized.    Assessment and Plan: Counselor recommends that Jamie Lee remain in IOP treatment to better manage mental health symptoms, ensure stability and pursue completion of treatment plan goals. Counselor recommends adherence to crisis/safety plan, taking medications as prescribed, and following up with medical professionals if any issues arise.    Follow Up Instructions: Counselor will send [employer] Teams link for session tomorrow.  Jamie Lee was advised to call back or seek an in-person evaluation if the symptoms worsen or if the condition fails to improve as anticipated.   Collaboration of Care:   Medication Management AEB Jamie Kerns, NP                                           Case Manager AEB Jamie Gaskins, CNA    Patient/Guardian was advised Release of Information must be obtained prior to any record release in order to collaborate their care with an outside provider. Patient/Guardian was advised if they have not already done so to contact the registration department to sign all necessary forms in order for us  to release information regarding their care.    Consent: Patient/Guardian gives verbal consent for treatment and assignment of benefits for services provided during this visit. Patient/Guardian expressed understanding and agreed to proceed.   I provided 180 minutes of non-face-to-face time during this encounter.   Jamie Ricker, LCSW, LCAS 02/15/24

## 2024-02-16 ENCOUNTER — Other Ambulatory Visit (HOSPITAL_COMMUNITY): Admitting: Licensed Clinical Social Worker

## 2024-02-16 DIAGNOSIS — F331 Major depressive disorder, recurrent, moderate: Secondary | ICD-10-CM | POA: Diagnosis not present

## 2024-02-16 DIAGNOSIS — F411 Generalized anxiety disorder: Secondary | ICD-10-CM

## 2024-02-16 NOTE — Progress Notes (Signed)
 Virtual Visit via Video Note   I connected with Jamie Lee on 02/16/24 at  9:00 AM EDT by a video enabled telemedicine application and verified that I am speaking with the correct person using two identifiers.   At orientation to the IOP program, Case Manager discussed the limitations of evaluation and management by telemedicine and the availability of in person appointments. The patient expressed understanding and agreed to proceed with virtual visits throughout the duration of the program.   Location:  Patient: Patient Home Provider: [facility]   History of Present Illness: MDD and GAD   Observations/Objective: Check In: Case Manager checked in with all participants to review discharge dates, insurance authorizations, work-related documents and needs from the treatment team regarding medications. Jamie Lee stated needs and engaged in discussion.    Initial Therapeutic Activity: Counselor facilitated a check-in with Jamie Lee to assess for safety, sobriety and medication compliance.  Counselor also inquired about Jamie Lee's current emotional ratings, as well as any significant changes in thoughts, feelings or behavior since previous check in.  Jamie Lee presented for session on time and was alert, oriented x5, with no evidence or self-report of active SI/HI or A/V H.  Jamie Lee reported compliance with medication and denied use of alcohol or illicit substances.  Jamie Lee reported scores of 5/10 for depression, 6/10 for anxiety, and 7/10 for anger/irritability.  Jamie Lee denied any recent outbursts or panic attacks.  Jamie Lee reported that a struggle was attending a doctor's appointment and feeling disappointed about not losing enough weight.  Jamie Lee reported that a success was getting some cleaning done around her home and spending time with a guest that visited.  Jamie Lee reported that her goal today is to find something to do for self-care, since she is still upset from the day before.             Second Therapeutic Activity: Counselor introduced topic of building a social support network today.  Counselor explained how this can be defined as having a having a group of healthy people in one's life you can talk to, spend time with, and get help from to improve both mental and physical health.  Counselor noted that some barriers can make it difficult to connect with other people, including the presence of anxiety or depression, or moving to an unfamiliar area.  Group members were asked to assess the current state of their support network, and identify ways that this could be improved.  Tips were given on how to address previously noted barriers, such as strengthening social skills, using relaxation techniques to reduce anxiety, scheduling social time each week, and/or exploring social events nearby which could increase chances of meeting new supports.  Members were also encouraged to consider getting closer to people they already know through suggestions such as outreaching someone by text, email or phone call if they haven't spoken in awhile, doing something nice for a friend/family member unexpectedly, and/or inviting someone over for a game/movie/dinner night.  Intervention was effective, as evidenced by Jamie Lee actively participating in discussion on the subject, and reporting that she cut off some supportive friends in her network 2 years ago, and has had several reach out to get in touch recently.  Jamie Lee stated "I am socially awkward to an extent.  I think I cut people off".  She reported that her goal will be to get in touch with 2 of her previously closest friends that have still been outreaching her, and discuss plans she would be comfortable at this point  in her recovery, such as having a movie night at her house.  Jamie Lee stated "I'm trying to be a better me.  We used to have really good times together".        Assessment and Plan: Counselor recommends that Jamie Lee remain in IOP  treatment to better manage mental health symptoms, ensure stability and pursue completion of treatment plan goals. Counselor recommends adherence to crisis/safety plan, taking medications as prescribed, and following up with medical professionals if any issues arise.    Follow Up Instructions: Counselor will send [employer] Teams link for session tomorrow.  Jamie Lee was advised to call back or seek an in-person evaluation if the symptoms worsen or if the condition fails to improve as anticipated.   Collaboration of Care:   Medication Management AEB Staci Kerns, NP                                           Case Manager AEB [name], CNA    Patient/Guardian was advised Release of Information must be obtained prior to any record release in order to collaborate their care with an outside provider. Patient/Guardian was advised if they have not already done so to contact the registration department to sign all necessary forms in order for us  to release information regarding their care.    Consent: Patient/Guardian gives verbal consent for treatment and assignment of benefits for services provided during this visit. Patient/Guardian expressed understanding and agreed to proceed.   I provided 180 minutes of non-face-to-face time during this encounter.   Darleene Ricker, LCSW, LCAS 02/16/24

## 2024-02-17 ENCOUNTER — Other Ambulatory Visit (HOSPITAL_COMMUNITY): Admitting: Licensed Clinical Social Worker

## 2024-02-17 DIAGNOSIS — F331 Major depressive disorder, recurrent, moderate: Secondary | ICD-10-CM

## 2024-02-17 DIAGNOSIS — F411 Generalized anxiety disorder: Secondary | ICD-10-CM

## 2024-02-17 NOTE — Progress Notes (Signed)
 Virtual Visit via Video Note   I connected with Jamie Lee on 02/17/24 at  9:00 AM EDT by a video enabled telemedicine application and verified that I am speaking with the correct person using two identifiers.   At orientation to the IOP program, Case Manager discussed the limitations of evaluation and management by telemedicine and the availability of in person appointments. The patient expressed understanding and agreed to proceed with virtual visits throughout the duration of the program.   Location:  Patient: Patient Home Provider: [facility]   History of Present Illness: MDD and GAD   Observations/Objective: Check In: Case Manager checked in with all participants to review discharge dates, insurance authorizations, work-related documents and needs from the treatment team regarding medications. Jamie Lee stated needs and engaged in discussion.    Initial Therapeutic Activity: Counselor facilitated a check-in with Jamie Lee to assess for safety, sobriety and medication compliance.  Counselor also inquired about Jamie Lee's current emotional ratings, as well as any significant changes in thoughts, feelings or behavior since previous check in.  Jamie Lee presented for session on time and was alert, oriented x5, with no evidence or self-report of active SI/HI or A/V H.  Jamie Lee reported compliance with medication and denied use of alcohol or illicit substances.  Jamie Lee reported scores of 5/10 for depression, 6/10 for anxiety, and 5/10 for irritability.  Jamie Lee denied any recent outbursts or panic attacks.  Jamie Lee reported that a struggle was having an argument with her son, and saying something hurtful, although she apologized later.  Jamie Lee reported that a success was getting out of the house yesterday to do some yardwork, stating "I'm replanting some flowers out front".  She reported that she also did some reading and journaling.  Jamie Lee reported that her goal today is do more yard  work to distract herself.          Second Therapeutic Activity: Counselor introduced topic of grounding skills today.  Counselor defined these as simple strategies one can use to help detach from difficult thoughts or feelings temporarily by focusing on something else.  Counselor noted that grounding will not solve the problem at hand, but can provide the practitioner with time to regain control over their thoughts and/or feelings and prevent the situation from getting worse (i.e. interrupting a panic attack).  Counselor divided these into three categories (mental, physical, and soothing) and then provided examples of each which group members could practice during session.  Some of these included describing one's environment in detail or playing a categories game with oneself for mental category, taking a hot bath/shower, stretching, or carrying a grounding object for physical category, and saying kind statements, or visualizing people one cares about for soothing category.  Counselor inquired about which techniques members have used with success in the past, or will commit to learning, practicing, and applying now to improve coping abilities.  Intervention was effective, as evidenced by Jamie Lee participating in discussion on the subject, trying out several of the techniques during session, and expressing interest in adding several to her available coping skills, such as describing her environment in detail, picturing herself relaxing on a deserted [place] all alone, reading the bible or scripture, playing a categories game involving listing various names, putting together a puzzle, counting to 10, splashing water on her face, squeezing a hard surface like the steering wheel in her car if stressed while driving, stretching her muscles, or fidgeting with nearby objects like a fidget cube.   Assessment and Plan: Counselor recommends  that Jamie Lee remain in IOP treatment to better manage mental health symptoms,  ensure stability and pursue completion of treatment plan goals. Counselor recommends adherence to crisis/safety plan, taking medications as prescribed, and following up with medical professionals if any issues arise.    Follow Up Instructions: Counselor will send [employer] Teams link for session tomorrow.  Jamie Lee was advised to call back or seek an in-person evaluation if the symptoms worsen or if the condition fails to improve as anticipated.   Collaboration of Care:   Medication Management AEB Jamie Kerns, NP                                           Case Manager AEB Jamie Gaskins, CNA    Patient/Guardian was advised Release of Information must be obtained prior to any record release in order to collaborate their care with an outside provider. Patient/Guardian was advised if they have not already done so to contact the registration department to sign all necessary forms in order for us  to release information regarding their care.    Consent: Patient/Guardian gives verbal consent for treatment and assignment of benefits for services provided during this visit. Patient/Guardian expressed understanding and agreed to proceed.   I provided 180 minutes of non-face-to-face time during this encounter.   Darleene Ricker, LCSW, LCAS 02/17/24

## 2024-02-18 ENCOUNTER — Other Ambulatory Visit (HOSPITAL_COMMUNITY): Admitting: Licensed Clinical Social Worker

## 2024-02-18 DIAGNOSIS — F331 Major depressive disorder, recurrent, moderate: Secondary | ICD-10-CM

## 2024-02-18 DIAGNOSIS — F411 Generalized anxiety disorder: Secondary | ICD-10-CM

## 2024-02-18 NOTE — Progress Notes (Signed)
 Virtual Visit via Video Note   I connected with Jamie Lee on 02/18/24 at  9:00 AM EDT by a video enabled telemedicine application and verified that I am speaking with the correct person using two identifiers.   At orientation to the IOP program, Case Manager discussed the limitations of evaluation and management by telemedicine and the availability of in person appointments. The patient expressed understanding and agreed to proceed with virtual visits throughout the duration of the program.   Location:  Patient: Patient Home Provider: [facility]   History of Present Illness: MDD and GAD   Observations/Objective: Check In: Case Manager checked in with all participants to review discharge dates, insurance authorizations, work-related documents and needs from the treatment team regarding medications. Jamie Lee stated needs and engaged in discussion.    Initial Therapeutic Activity: Counselor facilitated a check-in with Jamie Lee to assess for safety, sobriety and medication compliance.  Counselor also inquired about Jamie Lee's current emotional ratings, as well as any significant changes in thoughts, feelings or behavior since previous check in.  Jamie Lee presented for session on time and was alert, oriented x5, with no evidence or self-report of active SI/HI or A/V H.  Jamie Lee reported compliance with medication and denied use of alcohol or illicit substances.  Jamie Lee reported scores of 3/10 for depression, 5/10 for anxiety, and 7/10 for irritability.  Jamie Lee denied any recent outbursts or panic attacks.  Jamie Lee reported that a struggle was feeling irritated this morning due to sleeping poorly last night and getting woken up early by her son.  Jamie Lee reported that a success was getting her son to agree to family therapy due to ongoing issues between them.  Jamie Lee reported that her goal today is to look into a suitable family therapist to schedule an initial session with.           Second Therapeutic Activity: Counselor introduced topic of mental illness stigma today.  Counselor explained how stigma is defined as someone viewing another person in a negative way due to distinguishing characteristics or traits which are thought to be a disadvantage, including negative beliefs or attitudes associated with people who have a mental health condition such as major depression and/or generalized anxiety.  Counselor explained that stigma can lead to discrimination as well, and the harmful effects of pervasive stigma include reluctance to seek help or treatment, lack of understanding by family, friends, co-workers or peers, bullying/harassment, or negative impact on one's perceived ability to overcome challenges or succeed in life.  Counselor offered several suggestions for coping with stigma regarding mental illness, including staying engaged in treatment through linkage with a therapist, psychiatrist, and/or support group, avoiding isolation, and speaking out against stigma when encountered to reverse societal impact and increase acceptance and understanding.  Counselor inquired about members' experiences with this issue, as well as what they have done to minimize the impact it can have on their lives.  Intervention was effective, as evidenced by Jamie Lee actively engaging in discussion on the subject, reporting that this is a difficult subject for her, because she views stigma as similar to racism in the way that it is grounded in ignorance and misunderstanding.  Jamie Lee stated "I try to walk around like everything is okay.  I worry about someone labeling me as mentally ill".  Jamie Lee also participated in mental health stigma questionnaire, which revealed that she has a moderate level of stigma. Jamie Lee reported that group has been helpful for encouraging her to open up about her mental health without  fear of judgement, although she still struggles acknowledging that she has these conditions with  many people in her outside network. She stated "There is still some shame there".  She reported that she would also be more careful about using mental health terms such as 'bipolar' in a negative manner toward other people.      Assessment and Plan: Counselor recommends that Jamie Lee remain in IOP treatment to better manage mental health symptoms, ensure stability and pursue completion of treatment plan goals. Counselor recommends adherence to crisis/safety plan, taking medications as prescribed, and following up with medical professionals if any issues arise.    Follow Up Instructions: Counselor will send [employer] Teams link for session tomorrow.  Jamie Lee was advised to call back or seek an in-person evaluation if the symptoms worsen or if the condition fails to improve as anticipated.   Collaboration of Care:   Medication Management AEB Staci Kerns, NP                                           Case Manager AEB [name], CNA    Patient/Guardian was advised Release of Information must be obtained prior to any record release in order to collaborate their care with an outside provider. Patient/Guardian was advised if they have not already done so to contact the registration department to sign all necessary forms in order for us  to release information regarding their care.    Consent: Patient/Guardian gives verbal consent for treatment and assignment of benefits for services provided during this visit. Patient/Guardian expressed understanding and agreed to proceed.   I provided 180 minutes of non-face-to-face time during this encounter.   Darleene Ricker, LCSW, LCAS 02/18/24

## 2024-02-21 ENCOUNTER — Other Ambulatory Visit (HOSPITAL_COMMUNITY): Admitting: Family

## 2024-02-21 DIAGNOSIS — F331 Major depressive disorder, recurrent, moderate: Secondary | ICD-10-CM

## 2024-02-21 DIAGNOSIS — F411 Generalized anxiety disorder: Secondary | ICD-10-CM

## 2024-02-21 NOTE — Progress Notes (Signed)
 Virtual Visit via Video Note   I connected with Jamie Lee on 02/21/24 at  9:00 AM EDT by a video enabled telemedicine application and verified that I am speaking with the correct person using two identifiers.   At orientation to the IOP program, Case Manager discussed the limitations of evaluation and management by telemedicine and the availability of in person appointments. The patient expressed understanding and agreed to proceed with virtual visits throughout the duration of the program.   Location:  Patient: Patient Home Provider: [facility]   History of Present Illness: MDD and GAD   Observations/Objective: Check In: Case Manager checked in with all participants to review discharge dates, insurance authorizations, work-related documents and needs from the treatment team regarding medications. Jamie Lee stated needs and engaged in discussion.    Initial Therapeutic Activity: Counselor facilitated a check-in with Jamie Lee to assess for safety, sobriety and medication compliance.  Counselor also inquired about Jamie Lee's current emotional ratings, as well as any significant changes in thoughts, feelings or behavior since previous check in.  Jamie Lee presented for session on time and was alert, oriented x5, with no evidence or self-report of active SI/HI or A/V H.  Jamie Lee reported compliance with medication and denied use of alcohol or illicit substances.  Jamie Lee reported scores of 3/10 for depression, 6/10 for anxiety, and 6/10 for anger/irritability.  Jamie Lee denied any recent outbursts or panic attacks.  Jamie Lee reported that a success was spending a lot of time out of the house over the weekend shopping, although she was drained after being in Jamie [place] for 2 hours.  She reported that she was also able to attend church.     Second Therapeutic Activity: Counselor introduced topic of stress management today.  Counselor provided definition of stress as feeling tense, overwhelmed, worn  out, and/or exhausted, and noted that in small amounts, stress can be motivating until things become too overwhelming to manage.  Counselor also explained how stress can be acute (brief but intense) or chronic (long-lasting) and this can impact the severity of symptoms one can experience in the physical, emotional, and behavioral categories.  Counselor inquired about members' specific stressors, how long they have been prevalent, and the various symptoms that tend to manifest as a result.  Counselor also offered several stress management strategies to help improve members' coping ability, including journaling, gratitude practice, relaxation techniques, and time management tips.  Counselor also explained that research has shown a strong support network composed of trusted family, friends, or community members can increase resilience in times of stress, and inquired about who members can reach out to for help in managing stressors.  Counselor encouraged members to consider discussing stressor 'red flags' with their close supports that can be monitored and strategies for assisting them in times of crisis.  Intervention was effective, as evidenced by Jamie Lee actively participating in discussion on subject, stating "I used to thrive under stress and do well.  I can control stress at home better than at work".  She reported that her most significant stressors include family conflict, pain and fatigue, messiness in the house, and money worries.  Jamie Lee was able to identify several warning signs related to stress, including dizziness, nausea, lack of energy, and forgetfulness.  Jamie Lee reported that her stress management goal is to practice relaxation skills x3 per week in order to better manage daily stressors.  Jamie Lee also expressed receptiveness to several stress management strategies practiced today in session, including practicing a deep breathing exercise, practicing physical grounding  techniques, setting aside  time for self-care activities like knitting, and progressive muscle relaxation.         Assessment and Plan: Counselor recommends that Jamie Lee remain in IOP treatment to better manage mental health symptoms, ensure stability and pursue completion of treatment plan goals. Counselor recommends adherence to crisis/safety plan, taking medications as prescribed, and following up with medical professionals if any issues arise.    Follow Up Instructions: Counselor will send [employer] Teams link for session tomorrow.  Jamie Lee was advised to call back or seek an in-person evaluation if the symptoms worsen or if the condition fails to improve as anticipated.   Collaboration of Care:   Medication Management AEB Staci Kerns, NP                                           Case Manager AEB [name], CNA    Patient/Guardian was advised Release of Information must be obtained prior to any record release in order to collaborate their care with an outside provider. Patient/Guardian was advised if they have not already done so to contact the registration department to sign all necessary forms in order for us  to release information regarding their care.    Consent: Patient/Guardian gives verbal consent for treatment and assignment of benefits for services provided during this visit. Patient/Guardian expressed understanding and agreed to proceed.   I provided 180 minutes of non-face-to-face time during this encounter.   Darleene Ricker, LCSW, LCAS 02/21/24

## 2024-02-21 NOTE — Progress Notes (Signed)
  [facility] Intensive Outpatient Program Discharge Summary  Marion Seese [id number]  Admission date: 02/02/2024 Discharge date: 02/22/2024  Reason for admission: per admission assessment note; Jamie Lee is a 43 year old African-American female who presents after referral by her therapist Lynwood Maris. [name] stated I was having a really bad day.  Prior to the referral of intensive outpatient programming.  Reported her previous therapist was and able to speak to her the she was struggling with depression and anxiet.  States she then contacted Lynwood Maris who referred the program.  Progress in Program Toward Treatment Goals: Progressing patient attended and participated with daily group session with active and engaged participation.  Denying any suicidal or homicidal ideations.  Denied auditory or visual hallucinations.  Does report that she continues to take Abilify  with a few missed doses but overall states her mood has improved.  States she is eager to get back to work.  Reports she has a medication management follow-up appointment with [facility].  Will make medication refills available until follow-up appointment.  Progress (rationale): Take all of you medications as prescribed by your mental healthcare provider.  Report any adverse effects and reactions from your medications to your outpatient provider promptly.  Do not engage in alcohol and or illegal drug use while on prescription medicines. Keep all scheduled appointments. This is to ensure that you are getting refills on time and to avoid any interruption in your medication.  If you are unable to keep an appointment call to reschedule.  Be sure to follow up with resources and follow ups given. In the event of worsening symptoms call the crisis hotline, 911, and or go to the nearest emergency department for appropriate evaluation and treatment of symptoms. Follow-up with your primary care provider for  your medical issues, concerns and or health care needs.    Collaboration of Care: Medication Management AEB Abilify  5 mg daily  Patient/Guardian was advised Release of Information must be obtained prior to any record release in order to collaborate their care with an outside provider. Patient/Guardian was advised if they have not already done so to contact the registration department to sign all necessary forms in order for us  to release information regarding their care.   Consent: Patient/Guardian gives verbal consent for treatment and assignment of benefits for services provided during this visit. Patient/Guardian expressed understanding and agreed to proceed.   Staci Kerns, NP 02/21/2024

## 2024-02-22 ENCOUNTER — Other Ambulatory Visit (HOSPITAL_COMMUNITY): Admitting: Licensed Clinical Social Worker

## 2024-02-22 DIAGNOSIS — F331 Major depressive disorder, recurrent, moderate: Secondary | ICD-10-CM

## 2024-02-22 DIAGNOSIS — F411 Generalized anxiety disorder: Secondary | ICD-10-CM

## 2024-02-22 NOTE — Progress Notes (Signed)
 Virtual Visit via Video Note   I connected with Jamie Lee on 02/22/24 at  9:00 AM EDT by a video enabled telemedicine application and verified that I am speaking with the correct person using two identifiers.   At orientation to the IOP program, Case Manager discussed the limitations of evaluation and management by telemedicine and the availability of in person appointments. The patient expressed understanding and agreed to proceed with virtual visits throughout the duration of the program.   Location:  Patient: Patient Home Provider: [facility] Office   History of Present Illness: MDD and GAD   Observations/Objective: Check In: Case Manager checked in with all participants to review discharge dates, insurance authorizations, work-related documents and needs from the treatment team regarding medications. Jamie Lee stated needs and engaged in discussion.    Initial Therapeutic Activity: Counselor facilitated a check-in with Jamie Lee to assess for safety, sobriety and medication compliance.  Counselor also inquired about Jamie Lee's current emotional ratings, as well as any significant changes in thoughts, feelings or behavior since previous check in.  Jamie Lee presented for session on time and was alert, oriented x5, with no evidence or self-report of active SI/HI or A/V H.  Jamie Lee reported compliance with medication and denied use of alcohol or illicit substances.  Jamie Lee reported scores of 4/10 for depression, 8/10 for anxiety, and 7/10 for irritability.  Jamie Lee denied any recent outbursts or panic attacks.  Jamie Lee reported that a struggle was noticing increased anxiety yesterday, stating "I tried to do puzzles and relax my breathing but it was really hard".  She reported that she did a lot of physical activity outside to distract herself but did not sleep well last night.  Jamie Lee reported that her goal today is to avoid falling asleep, since she recognizes that this will mess up her sleep  schedule in the evening.      Second Therapeutic Activity: Counselor introduced Amanda Davee Lomax, [facility] to provide psychoeducation on topic of Grief and Loss with members today.  Jamie Lee began discussion by checking in with the group about their baseline mood today, general thoughts on what grief means to them and how it has affected them personally in the past.  Jamie Lee provided information on how the process of grief/loss can differ depending upon one's unique culture, and categories of loss one could experience (i.e. loss of a person, animal, relationship, job, identity, etc).  Jamie Lee encouraged members to be mindful of how pervasive loss can be, and how to recognize signs which could indicate that this is having an impact on one's overall mental health and wellbeing.  Intervention was effective, as evidenced by Jamie Lee participating in discussion with speaker on the subject, reporting that the loss of her mother took a toll on her, and at times she will lash out when she misses her.  Jamie Lee stated "Ill realize that her anniversary or birthday are coming up.  It takes me time to realize what's causing these feelings".  She reported that something which brings her joy and helps her cope with loss is distracting herself with some kind of puzzle such as word search, or jigsaw since they bring her comfort.    Third Therapeutic Activity: Counselor provided psychoeducation on subject of boundaries with group members today using a virtual handout.  This handout defined boundaries as the limits and rules that we set for ourselves within relationships, and featured a breakdown of the 3 common categories of boundaries (i.e. porous, rigid, and healthy), along with typical traits specific  to each one for easy identification.  It was noted that most people have a mixture of different boundary types depending on setting, person, and culture.  Additional information was provided on the types of boundaries (i.e.  physical, intellectual, emotional, sexual, material, and time) within relationships, and what could be considered healthy versus unhealthy. Counselor tasked members with identifying what types of boundaries they presently hold within her own support systems, the collective impact these boundaries have upon their mental health, and changes that could be made in order to more effectively communicate individual mental health needs.  Intervention was effective, as evidenced by Jamie Lee actively engaging in discussion on topic, reporting that she adjusts boundaries depending on how people treat her.  She stated "My boundaries are situational.  It depends on how they treat me.  I am a very giving person and if my boundaries are harsh, its for a reason".  Jamie Lee reported that in the household she struggles to set boundaries with her children, and has trouble saying "No" to them, which can lead her own needs to be neglected.  Jamie Lee reported that she would work to improve boundaries by staying engaged in therapy, and working with her therapist to learn assertive communication skills to better express herself, and her needs to family and friends.    Assessment and Plan: Jamie Lee has completed [facility]OP and will be discharged today.  Counselor recommends adherence to crisis/safety plan, taking medications as prescribed, and following up with medical professionals if any issues arise.    Follow Up Instructions: Jamie Lee was advised to call back or seek an in-person evaluation if the symptoms worsen or if the condition fails to improve as anticipated.   Collaboration of Care:   Medication Management AEB Staci Kerns, NP                                           Case Manager AEB [name], CNA    Patient/Guardian was advised Release of Information must be obtained prior to any record release in order to collaborate their care with an outside provider. Patient/Guardian was advised if they have not already done so to  contact the registration department to sign all necessary forms in order for us  to release information regarding their care.    Consent: Patient/Guardian gives verbal consent for treatment and assignment of benefits for services provided during this visit. Patient/Guardian expressed understanding and agreed to proceed.   I provided 180 minutes of non-face-to-face time during this encounter.   Darleene Ricker, LCSW, LCAS 02/22/24

## 2024-02-23 ENCOUNTER — Other Ambulatory Visit (HOSPITAL_COMMUNITY)

## 2024-02-24 ENCOUNTER — Other Ambulatory Visit (HOSPITAL_COMMUNITY)

## 2024-02-25 ENCOUNTER — Other Ambulatory Visit (HOSPITAL_COMMUNITY)

## 2024-02-28 ENCOUNTER — Other Ambulatory Visit (HOSPITAL_COMMUNITY)

## 2024-02-29 ENCOUNTER — Other Ambulatory Visit (HOSPITAL_COMMUNITY): Payer: Self-pay | Admitting: Family

## 2024-02-29 ENCOUNTER — Other Ambulatory Visit (HOSPITAL_COMMUNITY)

## 2024-03-01 ENCOUNTER — Other Ambulatory Visit (HOSPITAL_COMMUNITY)

## 2024-03-02 ENCOUNTER — Other Ambulatory Visit (HOSPITAL_COMMUNITY)

## 2024-03-03 ENCOUNTER — Other Ambulatory Visit (HOSPITAL_COMMUNITY)

## 2024-03-06 ENCOUNTER — Other Ambulatory Visit (HOSPITAL_COMMUNITY)

## 2024-03-07 ENCOUNTER — Other Ambulatory Visit (HOSPITAL_COMMUNITY)

## 2024-03-08 ENCOUNTER — Other Ambulatory Visit (HOSPITAL_COMMUNITY)

## 2024-03-09 ENCOUNTER — Other Ambulatory Visit (HOSPITAL_COMMUNITY)

## 2024-03-09 ENCOUNTER — Telehealth: Payer: Self-pay | Admitting: Internal Medicine

## 2024-03-09 NOTE — Telephone Encounter (Signed)
 Doris volunteer called to confirm appt for 8/15

## 2024-03-10 ENCOUNTER — Encounter: Payer: Self-pay | Admitting: Internal Medicine

## 2024-03-10 ENCOUNTER — Other Ambulatory Visit (HOSPITAL_COMMUNITY)

## 2024-03-10 ENCOUNTER — Ambulatory Visit: Attending: Internal Medicine | Admitting: Internal Medicine

## 2024-03-10 DIAGNOSIS — Z6841 Body Mass Index (BMI) 40.0 and over, adult: Secondary | ICD-10-CM

## 2024-03-10 DIAGNOSIS — F330 Major depressive disorder, recurrent, mild: Secondary | ICD-10-CM

## 2024-03-10 DIAGNOSIS — Z794 Long term (current) use of insulin: Secondary | ICD-10-CM

## 2024-03-10 DIAGNOSIS — Z7985 Long-term (current) use of injectable non-insulin antidiabetic drugs: Secondary | ICD-10-CM | POA: Diagnosis not present

## 2024-03-10 DIAGNOSIS — M25572 Pain in left ankle and joints of left foot: Secondary | ICD-10-CM

## 2024-03-10 DIAGNOSIS — E1169 Type 2 diabetes mellitus with other specified complication: Secondary | ICD-10-CM

## 2024-03-10 DIAGNOSIS — M25561 Pain in right knee: Secondary | ICD-10-CM

## 2024-03-10 DIAGNOSIS — M25571 Pain in right ankle and joints of right foot: Secondary | ICD-10-CM

## 2024-03-10 DIAGNOSIS — G8929 Other chronic pain: Secondary | ICD-10-CM

## 2024-03-10 DIAGNOSIS — M25562 Pain in left knee: Secondary | ICD-10-CM

## 2024-03-10 DIAGNOSIS — F411 Generalized anxiety disorder: Secondary | ICD-10-CM

## 2024-03-10 LAB — POCT GLYCOSYLATED HEMOGLOBIN (HGB A1C): HbA1c, POC (controlled diabetic range): 11.8 % — AB (ref 0.0–7.0)

## 2024-03-10 LAB — GLUCOSE, POCT (MANUAL RESULT ENTRY): POC Glucose: 289 mg/dL — AB (ref 70–99)

## 2024-03-10 MED ORDER — PEN NEEDLES 31G X 8 MM MISC: 6 refills | Status: DC

## 2024-03-10 MED ORDER — SEMAGLUTIDE(0.25 OR 0.5MG/DOS) 2 MG/3ML ~~LOC~~ SOPN
0.2500 mg | PEN_INJECTOR | SUBCUTANEOUS | 0 refills | Status: DC
Start: 2024-03-10 — End: 2024-04-05

## 2024-03-10 MED ORDER — INSULIN GLARGINE 100 UNITS/ML SOLOSTAR PEN
12.0000 [IU] | PEN_INJECTOR | Freq: Every day | SUBCUTANEOUS | 11 refills | Status: DC
Start: 2024-03-10 — End: 2024-03-16

## 2024-03-10 NOTE — Patient Instructions (Signed)
 VISIT SUMMARY:  Today, we discussed the management of your diabetes, joint pain, and mental health. Your blood sugar levels have been very high, and we need to make some changes to your medications and lifestyle to help control it. We also talked about your joint pain and how weight loss can help, as well as your ongoing treatment for depression and anxiety.  YOUR PLAN:  -TYPE 2 DIABETES MELLITUS, UNCONTROLLED: Your diabetes is not well controlled, with high blood sugar levels and symptoms like increased thirst, frequent urination, and blurred vision. We are starting you on Ozempic  0.25 mg weekly, which will increase to 0.5 mg after one month if tolerated, and an insulin  pen for 12 units at bedtime. We will also request your eye exam report, encourage dietary counseling, and advise on dietary changes, including reducing high-sugar fruits. Continue your exercise routine, and we will do routine blood tests and check your urine for protein.  -OBESITY: Your weight is contributing to your diabetes and joint pain. We recommend dietary counseling and modifications, including reducing high-sugar fruits, and continuing your exercise regimen to help with weight loss.  -CHRONIC ARTHRALGIA OF KNEES AND ANKLES: You have chronic joint pain in your knees and ankles, which is worsened by your weight. Continue taking duloxetine for pain management and work on weight loss to reduce the strain on your joints.  -DEPRESSION AND ANXIETY: Your depression and anxiety are better controlled with your current medications, duloxetine and Vraylar, but you still experience some anxiety. We will provide you with a list of behavioral health providers for psychiatric follow-up and recommend continuing your current medications.  INSTRUCTIONS:  Please follow up with a nutritionist for dietary counseling and make the recommended dietary changes. Continue your exercise routine and take your new medications as prescribed. We will need to  see you again for routine blood tests and to check your urine for protein. Additionally, follow up with a psychiatrist for ongoing mental health support.

## 2024-03-10 NOTE — Progress Notes (Signed)
 Patient ID: Jamie Lee, female    DOB: 1981-02-23  MRN: [id number]  CC: Diabetes (DM f/u./Discuss meds & possible changes/Discuss arthritis/Pain on R lower abdomen )   Subjective: Jamie Lee is a 43 y.o. female who presents for chronic ds management. Her concerns today include:  Patient with history of DM type II, anxiety, MDD.    Discussed the use of AI scribe software for clinical note transcription with the patient, who gave verbal consent to proceed.  History of Present Illness Jamie Lee is a 43 year old female with diabetes and obesity  who presents for chronic disease management.  DM/obesity:  Results for orders placed or performed in visit on 03/10/24  POCT glucose (manual entry)   Collection Time: 03/10/24 10:06 AM  Result Value Ref Range   POC Glucose 289 (A) 70 - 99 mg/dl  POCT glycosylated hemoglobin (Hb A1C)   Collection Time: 03/10/24 10:15 AM  Result Value Ref Range   Hemoglobin A1C     HbA1c POC (<> result, manual entry)     HbA1c, POC (prediabetic range)     HbA1c, POC (controlled diabetic range) 11.8 (A) 0.0 - 7.0 %   She has not been taking her diabetes medications, Trulicity  1.5 mg and glimepiride  4 mg, for over six months due to situations in her life that she was dealing with.  By the time she decided to start taking them again, she no longer had refills.  She recently resumed using her continuous glucose monitor about a month ago. Over the past 30 days, her blood sugar has been very high, with 91% of readings over 250. She experiences symptoms of high blood sugar, including increased thirst and frequent urination, up to three times a night. She also reports blurred vision and was told by her optometrist at [facility] 2-3 months ago that the diabetes is beginning to affect her eyes.  She is agreeable to signing a release for us  to get records  She has been trying to manage her diabetes through increased physical activity, going to the  gym three times a week, using a stair climber, vibration plate, squat machine, and weight machines. She has also been using bilberry herb as a tea to help lower her blood sugar.  She reports chronic joint pain, with her knees and ankles being the most affected. The pain sometimes feels like burning and has been present for years.  She is not sure of any swelling.  Endorses stiffness in the knees and ankles.  She wears Velcro ankle supports.  She was recently placed on Cymbalta about 3 weeks ago by her behavioral health specialist.  She has found this quite helpful in reducing her joint pains.    MDD/GAD: She has a history of depression and anxiety and is currently taking duloxetine, trazodone, and Vraylar.  Reports that Abilify  was discontinued.  She completed an intensive outpatient program and is looking for a psychiatrist for maintenance care. She feels her depression is better controlled, but she still experiences anxiety.    Patient Active Problem List   Diagnosis Date Noted   Tetanus, diphtheria, and acellular pertussis (Tdap) vaccination declined 10/13/2022   Type 2 diabetes mellitus with morbid obesity (HCC) 01/22/2022   GAD (generalized anxiety disorder) 01/22/2022   Major depressive disorder, single episode, moderate (HCC) 01/22/2022     Current Outpatient Medications on File Prior to Visit  Medication Sig Dispense Refill   Accu-Chek Softclix Lancets lancets Use as instructed 100 each  12   ARIPiprazole  (ABILIFY ) 5 MG tablet Take 1 tablet (5 mg total) by mouth daily. 30 tablet 0   Blood Glucose Monitoring Suppl (ACCU-CHEK GUIDE) w/Device KIT UAD 1 kit 0   Continuous Blood Gluc Receiver (FREESTYLE LIBRE 3 READER) DEVI 1 Device by Does not apply route daily. ICD E11.69, E66.01 1 each 0   Continuous Glucose Sensor (FREESTYLE LIBRE 3 PLUS SENSOR) MISC Use to check blood sugar continuously throughout the day. Change sensor every 15 days. E11.69 2 each 6   diclofenac  sodium (VOLTAREN ) 1 %  GEL Apply 2 g topically 4 (four) times daily. 100 g 1   Dulaglutide  (TRULICITY ) 1.5 MG/0.5ML SOAJ Inject 1.5 mg into the skin once a week. Must have office visit for refills 2 mL 0   DULoxetine (CYMBALTA) 30 MG capsule Take 30 mg by mouth daily.     glimepiride  (AMARYL ) 4 MG tablet Take 1 tablet (4 mg total) by mouth daily with breakfast. Please schedule appointment w/ Dr. Vicci for more refills. 30 tablet 0   glucose blood (ACCU-CHEK GUIDE) test strip Use as instructed 100 each 12   JUNEL FE 1.5/30 1.5-30 MG-MCG tablet Take 1 tablet by mouth daily.     predniSONE  (STERAPRED UNI-PAK 21 TAB) 10 MG (21) TBPK tablet Take as directed 21 tablet 0   traZODone (DESYREL) 50 MG tablet Take 50 mg by mouth at bedtime as needed.     busPIRone  (BUSPAR ) 7.5 MG tablet TAKE 1 TABLET(7.5 MG) BY MOUTH THREE TIMES DAILY (Patient not taking: Reported on 03/10/2024) 270 tablet 0   escitalopram  (LEXAPRO ) 20 MG tablet TAKE 1 TABLET(20 MG) BY MOUTH DAILY (Patient not taking: Reported on 03/10/2024) 90 tablet 1   No current facility-administered medications on file prior to visit.    Allergies  Allergen Reactions   Metformin And Related Diarrhea    Social History   Socioeconomic History   Marital status: Married    Spouse name: Not on file   Number of children: Not on file   Years of education: Not on file   Highest education level: Not on file  Occupational History   Not on file  Tobacco Use   Smoking status: Never   Smokeless tobacco: Never  Vaping Use   Vaping status: Never Used  Substance and Sexual Activity   Alcohol use: Yes    Comment: occ   Drug use: No   Sexual activity: Not Currently    Birth control/protection: Pill  Other Topics Concern   Not on file  Social History Narrative   Not on file   Social Drivers of Health   Financial Resource Strain: Low Risk  (03/10/2024)   Overall Financial Resource Strain (CARDIA)    Difficulty of Paying Living Expenses: Not very hard  Food  Insecurity: No Food Insecurity (03/10/2024)   Hunger Vital Sign    Worried About Running Out of Food in the Last Year: Never true    Ran Out of Food in the Last Year: Never true  Transportation Needs: No Transportation Needs (03/10/2024)   PRAPARE - Administrator, Civil Service (Medical): No    Lack of Transportation (Non-Medical): No  Physical Activity: Insufficiently Active (03/10/2024)   Exercise Vital Sign    Days of Exercise per Week: 3 days    Minutes of Exercise per Session: 30 min  Stress: Stress Concern Present (03/10/2024)   [employer] of Occupational Health - Occupational Stress Questionnaire    Feeling of Stress: To some  extent  Social Connections: Socially Integrated (03/10/2024)   Social Connection and Isolation Panel    Frequency of Communication with Friends and Family: Three times a week    Frequency of Social Gatherings with Friends and Family: Once a week    Attends Religious Services: More than 4 times per year    Active Member of [employer] or Organizations: Yes    Attends Engineer, structural: More than 4 times per year    Marital Status: Married  Catering manager Violence: Not At Risk (03/10/2024)   Humiliation, Afraid, Rape, and Kick questionnaire    Fear of Current or Ex-Partner: No    Emotionally Abused: No    Physically Abused: No    Sexually Abused: No    Family History  Problem Relation Age of Onset   Diabetes Maternal Grandfather    Blindness Maternal Grandfather    Heart disease Other    Hypertension Other    Cancer Other    Breast cancer Neg Hx     Past Surgical History:  Procedure Laterality Date   CHOLECYSTECTOMY     EYE SURGERY Bilateral     ROS: Review of Systems Negative except as stated above  PHYSICAL EXAM: BP 103/73 (BP Location: Left Arm, Patient Position: Sitting, Cuff Size: Large)   Pulse 81   Temp 97.9 F (36.6 C) (Oral)   Ht 5' 7 (1.702 m)   Wt 268 lb (121.6 kg)   SpO2 100%   BMI 41.97 kg/m    Wt Readings from Last 3 Encounters:  03/10/24 268 lb (121.6 kg)  10/13/22 267 lb (121.1 kg)  01/22/22 264 lb 3.2 oz (119.8 kg)    Physical Exam  General appearance - alert, well appearing, morbidly obese middle-age African-American female and in no distress Mental status - normal mood, behavior, speech, dress, motor activity, and thought processes Chest - clear to auscultation, no wheezes, rales or rhonchi, symmetric air entry Heart - normal rate, regular rhythm, normal S1, S2, no murmurs, rubs, clicks or gallops Musculoskeletal -knees: Large body habitus.  No point tenderness.  She has mild to moderate crepitus on passive movement of the right knee and mild crepitus on passive movement of the left knee.  She has good range of motion in both joints. Ankles: Ankle supports were removed.  No edema noted.  She has good range of motion. Extremities - peripheral pulses normal, no pedal edema, no clubbing or cyanosis Diabetic Foot Exam - Simple   Simple Foot Form Diabetic Foot exam was performed with the following findings: Yes 03/10/2024  1:37 PM  Visual Inspection No deformities, no ulcerations, no other skin breakdown bilaterally: Yes Sensation Testing Intact to touch and monofilament testing bilaterally: Yes Pulse Check Posterior Tibialis and Dorsalis pulse intact bilaterally: Yes Comments         Latest Ref Rng & Units 02/03/2023    1:56 PM 10/13/2022   11:56 AM 01/22/2022    3:57 PM  CMP  Glucose 70 - 99 mg/dL 820  709  670   BUN 6 - 24 mg/dL 11  11  12    Creatinine 0.57 - 1.00 mg/dL 9.27  9.26  9.21   Sodium 134 - 144 mmol/L 137  137  138   Potassium 3.5 - 5.2 mmol/L 4.5  4.2  4.5   Chloride 96 - 106 mmol/L 102  99  99   CO2 20 - 29 mmol/L 20  21  21    Calcium  8.7 - 10.2 mg/dL 10.0  10.1  10.3   Total Protein 6.0 - 8.5 g/dL 6.7   7.4   Total Bilirubin 0.0 - 1.2 mg/dL 0.4   0.4   Alkaline Phos 44 - 121 IU/L 67   70   AST 0 - 40 IU/L 16   19   ALT 0 - 32 IU/L 14   20     Lipid Panel     Component Value Date/Time   CHOL 160 02/03/2023 1356   TRIG 77 02/03/2023 1356   HDL 58 02/03/2023 1356   CHOLHDL 2.8 02/03/2023 1356   LDLCALC 87 02/03/2023 1356    CBC    Component Value Date/Time   WBC 4.9 02/03/2023 1356   WBC 5.5 03/15/2021 1900   RBC 4.45 02/03/2023 1356   RBC 4.19 03/15/2021 1900   HGB 13.8 02/03/2023 1356   HCT 41.9 02/03/2023 1356   PLT 163 02/03/2023 1356   MCV 94 02/03/2023 1356   MCH 31.0 02/03/2023 1356   MCH 31.3 03/15/2021 1900   MCHC 32.9 02/03/2023 1356   MCHC 33.2 03/15/2021 1900   RDW 12.2 02/03/2023 1356   LYMPHSABS 1.7 05/22/2010 1205   MONOABS 0.4 05/22/2010 1205   EOSABS 0.2 05/22/2010 1205   BASOSABS 0.0 05/22/2010 1205    ASSESSMENT AND PLAN: 1. Type 2 diabetes mellitus with morbid obesity (HCC) (Primary) Not at goal.  She has been off Trulicity  and Amaryl  for over 6 months.  Now that her mental health is better, she is committing to getting back on medications.  We discussed putting her on Ozempic  instead of Trulicity  to help bring about some weight loss and adding insulin  instead of Amaryl .  Patient informed that the Ozempic  is similar to Trulicity . I went over with pt how the medication works and potential side effects including nausea, vomiting, diarrhea/constipation, bowel blockage, palpitations and pancreatitis.  Advised to stop the medicine and be seen if pt develops any abdominal pain, vomiting, severe diarrhea/constipation or palpitations.  We will start with the 0.25 mg once a week.  Will bring her back in 4 weeks with the clinical pharmacist to see how she is doing and for further titration.  She may also choose to send me a MyChart message at 4 weeks to confirm that she is tolerating the medication so that we can increase the dose. - Will start Lantus  12 units at bedtime.  Went over signs and symptoms of hypoglycemia. - Discussed healthy eating habits.  She is agreeable to seeing a nutritionist. -Patient to  sign release for us  to get her eye exam that was done 2 to 3 months ago. - POCT glucose (manual entry) - POCT glycosylated hemoglobin (Hb A1C) - Microalbumin / creatinine urine ratio - Semaglutide ,0.25 or 0.5MG /DOS, 2 MG/3ML SOPN; Inject 0.25 mg into the skin once a week.  Dispense: 3 mL; Refill: 0 - insulin  glargine (LANTUS ) 100 unit/mL SOPN; Inject 12 Units into the skin at bedtime.  Dispense: 15 mL; Refill: 11 - Insulin  Pen Needle (PEN NEEDLES) 31G X 8 MM MISC; UAD  Dispense: 100 each; Refill: 6 - Amb ref to [facility] - CBC - Comprehensive metabolic panel with GFR - Lipid panel  2. Chronic pain of both knees Most likely osteoarthritis.  Discussed the importance of weight loss.  Continue regular exercise.  She is on Cymbalta for depression and anxiety but this has the added benefit of decreasing musculoskeletal pain.  3. Chronic pain of both ankles See #2 above  4. GAD (generalized anxiety  disorder) 5. Major depressive disorder, recurrent episode, mild (HCC) Doing better on her psychiatric medications.  She needs to get established with a new psychiatrist.  I offered to refer.  She preferred to have a list of behavioral health providers in the area and she herself will call to schedule new patient appointment.  List given today by my CMA.  Patient was given the opportunity to ask questions.  Patient verbalized understanding of the plan and was able to repeat key elements of the plan.   This documentation was completed using Paediatric nurse.  Any transcriptional errors are unintentional.  Orders Placed This Encounter  Procedures   POCT glucose (manual entry)   POCT glycosylated hemoglobin (Hb A1C)     Requested Prescriptions    No prescriptions requested or ordered in this encounter    No follow-ups on file.  Barnie Louder, MD, FACP

## 2024-03-11 ENCOUNTER — Ambulatory Visit: Payer: Self-pay | Admitting: Internal Medicine

## 2024-03-11 ENCOUNTER — Other Ambulatory Visit: Payer: Self-pay | Admitting: Internal Medicine

## 2024-03-11 DIAGNOSIS — E1169 Type 2 diabetes mellitus with other specified complication: Secondary | ICD-10-CM

## 2024-03-11 MED ORDER — ATORVASTATIN CALCIUM 10 MG PO TABS: 10.0000 mg | ORAL_TABLET | Freq: Every day | ORAL | 3 refills | Status: AC

## 2024-03-12 LAB — LIPID PANEL
Chol/HDL Ratio: 2.2 ratio (ref 0.0–4.4)
Cholesterol, Total: 206 mg/dL — ABNORMAL HIGH (ref 100–199)
HDL: 94 mg/dL (ref >39)
LDL Chol Calc (NIH): 97 mg/dL (ref 0–99)
Triglycerides: 87 mg/dL (ref 0–149)
VLDL Cholesterol Cal: 15 mg/dL (ref 5–40)

## 2024-03-12 LAB — CBC
Hematocrit: 45.7 % (ref 34.0–46.6)
Hemoglobin: 14.7 g/dL (ref 11.1–15.9)
MCH: 31.1 pg (ref 26.6–33.0)
MCHC: 32.2 g/dL (ref 31.5–35.7)
MCV: 97 fL (ref 79–97)
Platelets: 178 x10E3/uL (ref 150–450)
RBC: 4.72 x10E6/uL (ref 3.77–5.28)
RDW: 11.8 % (ref 11.7–15.4)
WBC: 4.9 x10E3/uL (ref 3.4–10.8)

## 2024-03-12 LAB — COMPREHENSIVE METABOLIC PANEL WITH GFR
ALT: 19 IU/L (ref 0–32)
AST: 16 IU/L (ref 0–40)
Albumin: 4.6 g/dL (ref 3.9–4.9)
Alkaline Phosphatase: 98 IU/L (ref 44–121)
BUN/Creatinine Ratio: 10 (ref 9–23)
BUN: 7 mg/dL (ref 6–24)
Bilirubin Total: 0.7 mg/dL (ref 0.0–1.2)
CO2: 23 mmol/L (ref 20–29)
Calcium: 10.4 mg/dL — ABNORMAL HIGH (ref 8.7–10.2)
Chloride: 96 mmol/L (ref 96–106)
Creatinine, Ser: 0.71 mg/dL (ref 0.57–1.00)
Globulin, Total: 2.8 g/dL (ref 1.5–4.5)
Glucose: 284 mg/dL — ABNORMAL HIGH (ref 70–99)
Potassium: 4.9 mmol/L (ref 3.5–5.2)
Sodium: 136 mmol/L (ref 134–144)
Total Protein: 7.4 g/dL (ref 6.0–8.5)
eGFR: 108 mL/min/1.73 (ref >59)

## 2024-03-12 LAB — MICROALBUMIN / CREATININE URINE RATIO
Creatinine, Urine: 102.8 mg/dL
Microalb/Creat Ratio: 5 mg/g{creat} (ref 0–29)
Microalbumin, Urine: 5 ug/mL

## 2024-03-13 ENCOUNTER — Telehealth: Payer: Self-pay

## 2024-03-13 NOTE — Telephone Encounter (Signed)
 Copied from CRM #[id number]. Topic: Clinical - Prescription Issue >> Mar 13, 2024  3:49 PM Selinda RAMAN wrote: Reason for CRM: The patient called in stating her pharmacy [facility] told her that her provider needs more specific directions regarding the use of her BD PEN NEEDLE SHORT ULTRAFINE 31G X 8 MM MISC. Please assist patient further by sending in specific directions as soon as possible to   [id number] - [place], [place] - [address] AT [facility] [address] Phone: [phone] Fax: [phone]

## 2024-03-15 ENCOUNTER — Telehealth: Payer: Self-pay | Admitting: Internal Medicine

## 2024-03-15 MED ORDER — BD PEN NEEDLE SHORT ULTRAFINE 31G X 8 MM MISC
6 refills | Status: DC
Start: 2024-03-15 — End: 2024-03-16

## 2024-03-15 NOTE — Addendum Note (Signed)
 Addended by: VICCI SOBER B on: 03/15/2024 07:50 AM   Modules accepted: Orders

## 2024-03-15 NOTE — Telephone Encounter (Signed)
 New prescription sent

## 2024-03-15 NOTE — Telephone Encounter (Unsigned)
 Copied from CRM [id number]. Topic: Clinical - Medication Question >> Mar 15, 2024  4:07 PM Marissa P wrote: Reason for CRM: Patient called back to speak with Alarcon, Clarisa N, CMA excessive wait by cal and customer disconnected. Relayed message that was left but she had further concerns to discuss with Alarcon, Clarisa N, CMA, please contact patient back thank you.

## 2024-03-15 NOTE — Telephone Encounter (Signed)
 Called but no answer. LVM informing that the requested prescription has been sent with the needed instructions to the requested pharmacy.

## 2024-03-15 NOTE — Telephone Encounter (Signed)
 Called but no answer. LVM to call back.

## 2024-03-15 NOTE — Telephone Encounter (Unsigned)
 Copied from CRM #[id number]. Topic: Clinical - Prescription Issue >> Mar 15, 2024  5:24 PM Tiffini S wrote: Reason for CRM: Patient states that [facility] on [address] do not have the insulin  medication is stock and surrounding locations are out as well. This is a new medication that she was suppose to start last week.  insulin  glargine pen 15ml/ patient call the patient at [phone]- please left a detailed message

## 2024-03-16 ENCOUNTER — Other Ambulatory Visit: Payer: Self-pay

## 2024-03-16 ENCOUNTER — Other Ambulatory Visit: Payer: Self-pay | Admitting: Pharmacist

## 2024-03-16 DIAGNOSIS — E1169 Type 2 diabetes mellitus with other specified complication: Secondary | ICD-10-CM

## 2024-03-16 MED ORDER — BD PEN NEEDLE SHORT ULTRAFINE 31G X 8 MM MISC
6 refills | Status: DC
Start: 2024-03-16 — End: 2024-05-24
  Filled 2024-03-16: qty 100, 30d supply, fill #0
  Filled 2024-04-07: qty 100, 30d supply, fill #1
  Filled 2024-05-09: qty 100, 30d supply, fill #2

## 2024-03-16 MED ORDER — LANTUS SOLOSTAR 100 UNIT/ML ~~LOC~~ SOPN
12.0000 [IU] | PEN_INJECTOR | Freq: Every day | SUBCUTANEOUS | 2 refills | Status: DC
  Filled 2024-03-16: qty 3, 25d supply, fill #0
  Filled 2024-04-07: qty 3, 25d supply, fill #1
  Filled 2024-05-01: qty 3, 25d supply, fill #2

## 2024-03-16 MED ORDER — BASAGLAR KWIKPEN 100 UNIT/ML ~~LOC~~ SOPN
12.0000 [IU] | PEN_INJECTOR | Freq: Every day | SUBCUTANEOUS | 1 refills | Status: DC
  Filled 2024-03-16: qty 12, 100d supply, fill #0

## 2024-03-16 NOTE — Telephone Encounter (Signed)
 Insulin  substitute sent to [facility] pharmacy by pharmacist. Patient is aware.

## 2024-03-17 ENCOUNTER — Other Ambulatory Visit: Payer: Self-pay

## 2024-03-24 ENCOUNTER — Ambulatory Visit: Admitting: Internal Medicine

## 2024-04-04 ENCOUNTER — Telehealth: Payer: Self-pay | Admitting: Internal Medicine

## 2024-04-04 DIAGNOSIS — E1169 Type 2 diabetes mellitus with other specified complication: Secondary | ICD-10-CM

## 2024-04-04 NOTE — Telephone Encounter (Signed)
 Copied from CRM [id number]. Topic: Clinical - Medication Question >> Apr 04, 2024 10:59 AM Leonette SQUIBB wrote: Reason for CRM:  Pt called saying Dr. Vicci told her to reach out to her when she needed to go up in the next dosage of Ozempic  .  The patient is ready for the next dosage.  [facility] [address]  CB#  [phone]

## 2024-04-05 MED ORDER — SEMAGLUTIDE(0.25 OR 0.5MG/DOS) 2 MG/3ML ~~LOC~~ SOPN
0.5000 mg | PEN_INJECTOR | SUBCUTANEOUS | 1 refills | Status: DC
Start: 2024-04-05 — End: 2024-04-21

## 2024-04-05 NOTE — Addendum Note (Signed)
 Addended by: VICCI SOBER B on: 04/05/2024 11:14 AM   Modules accepted: Orders

## 2024-04-05 NOTE — Telephone Encounter (Signed)
 Called but no answer. LVM informing that an updated prescription with the dosage increase was sent to her pharmacy.

## 2024-04-05 NOTE — Telephone Encounter (Signed)
 Prescription sent to her pharmacy for increase dose Ozempic  0.5 mg once a week.

## 2024-04-06 ENCOUNTER — Ambulatory Visit (HOSPITAL_COMMUNITY)
Admission: EM | Admit: 2024-04-06 | Discharge: 2024-04-06 | Disposition: A | Discharge status: Home / Self Care | Attending: Emergency Medicine | Admitting: Emergency Medicine

## 2024-04-06 ENCOUNTER — Encounter (HOSPITAL_COMMUNITY): Payer: Self-pay

## 2024-04-06 DIAGNOSIS — N898 Other specified noninflammatory disorders of vagina: Secondary | ICD-10-CM

## 2024-04-06 DIAGNOSIS — Z113 Encounter for screening for infections with a predominantly sexual mode of transmission: Secondary | ICD-10-CM | POA: Diagnosis not present

## 2024-04-06 DIAGNOSIS — L292 Pruritus vulvae: Secondary | ICD-10-CM | POA: Insufficient documentation

## 2024-04-06 MED ORDER — FLUCONAZOLE 150 MG PO TABS: ORAL_TABLET | ORAL | 0 refills | Status: AC

## 2024-04-06 MED ORDER — METRONIDAZOLE 500 MG PO TABS: 500.0000 mg | ORAL_TABLET | Freq: Two times a day (BID) | ORAL | 0 refills | Status: DC

## 2024-04-06 NOTE — Discharge Instructions (Addendum)
 I am treating you empirically for yeast vaginitis and bacterial vaginosis.  Take the medications as prescribed.  Take the Flagyl  with food to prevent stomach upset.  Results will be available via MyChart over the next few days and staff will contact if additional treatment is needed.  Return to clinic for any new or urgent symptoms.

## 2024-04-06 NOTE — ED Provider Notes (Signed)
 [facility]    CSN: [id number] Arrival date & time: 04/06/24  1805      History   Chief Complaint Chief Complaint  Patient presents with   Vaginal Itching    HPI Kenza Munar is a 43 y.o. female.   Patient presents to clinic over concern of vaginal itching with an odor for the past week.  Around 2 weeks ago on her menstrual cycle she noticed a fishy odor, after her menstrual cycle was completed she did a boric acid suppository and her symptoms improved.  Had an IUD placed last week, symptoms started shortly after this.  Patient does have a history of type 2 diabetes, permanent blood glucose is in the 200s and nonfasting.  Patient denies concern for sexually transmitted infections.  Denies new partners.  Denies urinary symptoms.  The history is provided by the patient and medical records.  Vaginal Itching    Past Medical History:  Diagnosis Date   Arthritis    Diabetes mellitus without complication (HCC)    Schnyder's crystalline corneal dystrophy    Sciatica    Vision abnormalities     Patient Active Problem List   Diagnosis Date Noted   Tetanus, diphtheria, and acellular pertussis (Tdap) vaccination declined 10/13/2022   Type 2 diabetes mellitus with morbid obesity (HCC) 01/22/2022   GAD (generalized anxiety disorder) 01/22/2022   Major depressive disorder, single episode, moderate (HCC) 01/22/2022    Past Surgical History:  Procedure Laterality Date   CHOLECYSTECTOMY     EYE SURGERY Bilateral     OB History   No obstetric history on file.      Home Medications    Prior to Admission medications   Medication Sig Start Date End Date Taking? Authorizing Provider  fluconazole  (DIFLUCAN ) 150 MG tablet Take 1 tablet today, another on day 3 of antibiotics and another on last day of antibiotics. 04/06/24  Yes Fernanda Twaddell  N, FNP  metroNIDAZOLE  (FLAGYL ) 500 MG tablet Take 1 tablet (500 mg total) by mouth 2 (two) times daily. 04/06/24  Yes  Cristin Penaflor  N, FNP  tretinoin (RETIN-A) 0.05 % cream Apply topically 3 (three) times a week. 02/13/24  Yes [provider]  Accu-Chek Softclix Lancets lancets Use as instructed 01/22/22   Vicci Barnie NOVAK, MD  atorvastatin  (LIPITOR) 10 MG tablet Take 1 tablet (10 mg total) by mouth daily. 03/11/24   Vicci Barnie NOVAK, MD  Blood Glucose Monitoring Suppl (ACCU-CHEK GUIDE) w/Device KIT UAD 01/22/22   Vicci Barnie NOVAK, MD  cariprazine (VRAYLAR) 3 MG capsule Take 3 mg by mouth daily.    [provider]  Continuous Blood Gluc Receiver (FREESTYLE LIBRE 3 READER) DEVI 1 Device by Does not apply route daily. ICD E11.69, E66.01 10/14/22   Vicci Barnie NOVAK, MD  Continuous Glucose Sensor (FREESTYLE LIBRE 3 PLUS SENSOR) MISC Use to check blood sugar continuously throughout the day. Change sensor every 15 days. E11.69 05/21/23   Vicci Barnie NOVAK, MD  DULoxetine (CYMBALTA) 30 MG capsule Take 30 mg by mouth daily. 02/24/24   [provider]  glucose blood (ACCU-CHEK GUIDE) test strip Use as instructed 01/22/22   Vicci Barnie NOVAK, MD  insulin  glargine (LANTUS  SOLOSTAR) 100 UNIT/ML Solostar Pen Inject 12 Units into the skin daily. 03/16/24   Vicci Barnie NOVAK, MD  Insulin  Pen Needle (BD PEN NEEDLE SHORT ULTRAFINE) 31G X 8 MM MISC Use once daily to administer insulin  and once a week to administer Ozempic . 03/16/24   Vicci Barnie NOVAK, MD  Semaglutide ,0.25 or 0.5MG /DOS, 2 MG/3ML SOPN Inject 0.5 mg into the skin once a week. 04/05/24   Vicci Barnie NOVAK, MD  traZODone (DESYREL) 50 MG tablet Take 50 mg by mouth at bedtime as needed. 02/24/24   [provider]    Family History Family History  Problem Relation Age of Onset   Diabetes Maternal Grandfather    Blindness Maternal Grandfather    Heart disease Other    Hypertension Other    Cancer Other    Breast cancer Neg Hx     Social History Social History   Tobacco Use   Smoking status: Never   Smokeless tobacco:  Never  Vaping Use   Vaping status: Never Used  Substance Use Topics   Alcohol use: Yes    Comment: rare   Drug use: No     Allergies   Metformin and related   Review of Systems Review of Systems  Per HPI  Physical Exam Triage Vital Signs ED Triage Vitals  Encounter Vitals Group     BP 04/06/24 1904 131/85     Girls Systolic BP Percentile --      Girls Diastolic BP Percentile --      Boys Systolic BP Percentile --      Boys Diastolic BP Percentile --      Pulse Rate 04/06/24 1904 89     Resp 04/06/24 1904 16     Temp 04/06/24 1904 98.3 F (36.8 C)     Temp Source 04/06/24 1904 Oral     SpO2 04/06/24 1904 96 %     Weight --      Height --      Head Circumference --      Peak Flow --      Pain Score 04/06/24 1905 0     Pain Loc --      Pain Education --      Exclude from Growth Chart --    No data found.  Updated Vital Signs BP 131/85 (BP Location: Right Arm)   Pulse 89   Temp 98.3 F (36.8 C) (Oral)   Resp 16   LMP 03/23/2024 (Approximate)   SpO2 96%   Visual Acuity Right Eye Distance:   Left Eye Distance:   Bilateral Distance:    Right Eye Near:   Left Eye Near:    Bilateral Near:     Physical Exam Vitals and nursing note reviewed.  Constitutional:      Appearance: Normal appearance.  HENT:     Head: Normocephalic and atraumatic.     Right Ear: External ear normal.     Left Ear: External ear normal.     Nose: Nose normal.     Mouth/Throat:     Mouth: Mucous membranes are moist.  Eyes:     Conjunctiva/sclera: Conjunctivae normal.  Cardiovascular:     Rate and Rhythm: Normal rate.  Pulmonary:     Effort: Pulmonary effort is normal. No respiratory distress.  Neurological:     General: No focal deficit present.     Mental Status: She is alert and oriented to person, place, and time.  Psychiatric:        Mood and Affect: Mood normal.        Behavior: Behavior normal.      UC Treatments / Results  Labs (all labs ordered are listed,  but only abnormal results are displayed) Labs Reviewed  CERVICOVAGINAL ANCILLARY ONLY    EKG   Radiology No results found.  Procedures Procedures (including critical care time)  Medications Ordered in UC Medications - No data to display  Initial Impression / Assessment and Plan / UC Course  I have reviewed the triage vital signs and the nursing notes.  Pertinent labs & imaging results that were available during my care of the patient were reviewed by me and considered in my medical decision making (see chart for details).  Vitals and triage reviewed, patient is hemodynamically stable.  Cytology swab obtained, several contact if additional treatment is needed.  Empiric treatment for yeast and BV due to symptoms.  Low concern for PID at this time, afebrile and without pelvic pain.  Plan of care, follow-up care return precautions given, no questions at this time.     Final Clinical Impressions(s) / UC Diagnoses   Final diagnoses:  Vaginal itching  Vaginal odor     Discharge Instructions      I am treating you empirically for yeast vaginitis and bacterial vaginosis.  Take the medications as prescribed.  Take the Flagyl  with food to prevent stomach upset.  Results will be available via MyChart over the next few days and staff will contact if additional treatment is needed.  Return to clinic for any new or urgent symptoms.    ED Prescriptions     Medication Sig Dispense Auth. Provider   metroNIDAZOLE  (FLAGYL ) 500 MG tablet Take 1 tablet (500 mg total) by mouth 2 (two) times daily. 14 tablet Morrissa Shein  N, FNP   fluconazole  (DIFLUCAN ) 150 MG tablet Take 1 tablet today, another on day 3 of antibiotics and another on last day of antibiotics. 3 tablet Dreama, Kajuan Guyton  N, FNP      PDMP not reviewed this encounter.   Dreama, Silvano Garofano  N, FNP 04/06/24 1947

## 2024-04-06 NOTE — ED Triage Notes (Signed)
 Patient here today with c/o vaginal itching and odor X 1 week. Patient had an IUD placed last week and states that the symptoms started right after having this placed. Patient thinks that she has BV/yeast infection. Patient requesting a medication for this.

## 2024-04-07 ENCOUNTER — Other Ambulatory Visit: Payer: Self-pay

## 2024-04-07 LAB — CERVICOVAGINAL ANCILLARY ONLY
Bacterial Vaginitis (gardnerella): NEGATIVE
Candida Glabrata: NEGATIVE
Candida Vaginitis: NEGATIVE
Chlamydia: NEGATIVE
Comment: NEGATIVE
Comment: NEGATIVE
Comment: NEGATIVE
Comment: NEGATIVE
Comment: NEGATIVE
Comment: NORMAL
Neisseria Gonorrhea: NEGATIVE
Trichomonas: NEGATIVE

## 2024-04-11 ENCOUNTER — Telehealth: Payer: Self-pay

## 2024-04-11 ENCOUNTER — Other Ambulatory Visit: Payer: Self-pay

## 2024-04-11 NOTE — Telephone Encounter (Signed)
 Copied from CRM [id number]. Topic: Clinical - Medication Prior Auth >> Apr 11, 2024  1:13 PM Jamie Lee wrote: Reason for CRM:  Patient called in stating that she called her pharmacy and was informed that her Rx: Semaglutide ,0.25 or 0.5MG /DOS, 2 MG/3ML SOPN Needs a prior-authorization to be approved, Jamie Lee stated that the pharmacy sent over a prior-authorization yesterday but I haven't seen it in her chart.  If we could get this approved so the patient could begin treatment, that'd be wonderful.

## 2024-04-12 ENCOUNTER — Other Ambulatory Visit: Payer: Self-pay

## 2024-04-12 NOTE — Telephone Encounter (Signed)
 Dr.Johnson just and FYI.   Noted. Thank you.

## 2024-04-20 ENCOUNTER — Telehealth: Payer: Self-pay | Admitting: Internal Medicine

## 2024-04-20 NOTE — Telephone Encounter (Signed)
 LVM to confirm appt for 9/26

## 2024-04-21 ENCOUNTER — Ambulatory Visit: Attending: Internal Medicine | Admitting: Pharmacist

## 2024-04-21 ENCOUNTER — Encounter: Payer: Self-pay | Admitting: Pharmacist

## 2024-04-21 DIAGNOSIS — E1169 Type 2 diabetes mellitus with other specified complication: Secondary | ICD-10-CM | POA: Diagnosis not present

## 2024-04-21 DIAGNOSIS — Z7985 Long-term (current) use of injectable non-insulin antidiabetic drugs: Secondary | ICD-10-CM

## 2024-04-21 DIAGNOSIS — Z794 Long term (current) use of insulin: Secondary | ICD-10-CM

## 2024-04-21 MED ORDER — SEMAGLUTIDE (1 MG/DOSE) 4 MG/3ML ~~LOC~~ SOPN: 1.0000 mg | PEN_INJECTOR | SUBCUTANEOUS | 0 refills | Status: DC

## 2024-04-21 NOTE — Progress Notes (Signed)
 S:     No chief complaint on file.  43 y.o. female who presents for diabetes evaluation, education, and management. PMH is significant for T2DM, MDD, GAD.   Patient was referred and last seen by Primary Care Provider, Dr. Vicci, on 03/10/2024. A1c at that appt was 11.8 (up from 9.3 prior). At that visit, pt admitted to medication non-adherence. Prior to that appt, she had rxns for glimepiride  and Trulicity . She told Dr. Vicci that she had not taken her medications in ~6 months due to stressful life situations. Of note, she had started using her CGM again with her sugar readings > 250 mg/dL 09% of the 30 days prior to her appt with Dr. Vicci. Dr. Vicci started her on Ozempic  and insulin  and stopped Trulicity  and glimepiride .   Today, patient arrives in good spirits and presents without any assistance.  Patient reports Diabetes is longstanding. She is tolerating the Ozempic  well. Denies any NV, abdominal pain. No changes in vision. She is up to the 0.5 mg weekly dose. She does endorse polyphagia but is asymptomatic otherwise. She is using her Libre CGM and brings it for review today.   Family/Social History:  -Fhx: heart disease, DM, kidney disease -Tobacco: never smoker -Alcohol: none   Current diabetes medications include: Ozempic  0.5 mg weekly, Lantus  12 units daily Patient reports adherence to taking all medications as prescribed.   Insurance coverage: none  Patient denies hypoglycemic events.  Patient denies nocturia (nighttime urination).  Patient denies neuropathy (nerve pain). Patient denies visual changes. Patient reports self foot exams.   Patient reported dietary habits:  -Knows the right things to eat but admits to dietary indiscretion -Tells me today that she has battles with GAD and is an emotional eater  -She admits to eating whatever she can  Patient-reported exercise habits: none   O:  CGM 30-day report Average Glucose: 220 mg/dL Glucose Management  Indicator: 8.6%  Time in Goal:  - Time in range 70-180: 24% - Time above range: 76% - Time below range: 0%  Lab Results  Component Value Date   HGBA1C 11.8 (A) 03/10/2024   There were no vitals filed for this visit.  Lipid Panel     Component Value Date/Time   CHOL 206 (H) 03/10/2024 1135   TRIG 87 03/10/2024 1135   HDL 94 03/10/2024 1135   CHOLHDL 2.2 03/10/2024 1135   LDLCALC 97 03/10/2024 1135    Clinical Atherosclerotic Cardiovascular Disease (ASCVD): No  The 10-year ASCVD risk score (Arnett DK, et al., 2019) is: 0.8%   Values used to calculate the score:     Age: 65 years     Clincally relevant sex: Female     Is Non-Hispanic African American: Yes     Diabetic: Yes     Tobacco smoker: No     Systolic Blood Pressure: 131 mmHg     Is BP treated: No     HDL Cholesterol: 94 mg/dL     Total Cholesterol: 206 mg/dL   A/P: Diabetes longstanding currently uncontrolled based on A1c last month but improving based on AGP report. She is tolerating Ozempic  well and I have sent the rxn for the 1 mg dose. She can pick this up and begin it after completing 3 more doses of the 0.5 mg weekly dose. Patient is able to verbalize appropriate hypoglycemia management plan but is not symptomatic at this time. Medication adherence appears appropriate. - Continue Ozempic  0.5 mg weekly x3 more doses. Then, increase to  1 mg weekly thereafter.  - Continue Lantus  12 units daily for now. May need to decrease in the future as we increase Ozempic .   -CGM use encouraged.  -Patient educated on purpose, proper use, and potential adverse effects of Ozempic , Lantus .  -Extensively discussed pathophysiology of diabetes, recommended lifestyle interventions, dietary effects on blood sugar control.  -Counseled on s/sx of and management of hypoglycemia.  -Next A1c anticipated 05/2024.  Written patient instructions provided. Patient verbalized understanding of treatment plan.  Total time in face to face  counseling 30 minutes.    Follow-up:  Pharmacist in 1 month.  Herlene Fleeta Morris, PharmD, JAQUELINE, CPP Clinical Pharmacist [facility] [phone]

## 2024-04-25 ENCOUNTER — Other Ambulatory Visit: Payer: Self-pay

## 2024-05-08 ENCOUNTER — Other Ambulatory Visit: Payer: Self-pay

## 2024-05-09 ENCOUNTER — Other Ambulatory Visit: Payer: Self-pay

## 2024-05-22 ENCOUNTER — Ambulatory Visit: Admitting: Skilled Nursing Facility1

## 2024-05-23 ENCOUNTER — Telehealth: Payer: Self-pay | Admitting: Internal Medicine

## 2024-05-23 NOTE — Telephone Encounter (Signed)
 Hi, Please let me know if a telephone visit would be possible, and I'll call to inform the patient.

## 2024-05-23 NOTE — Telephone Encounter (Addendum)
 Called patient to inform her appointment changed to a telephone visit and informed her to have her CGM (glucose sensor data) readings with her at the time of that appt. Patient acknowledged.

## 2024-05-23 NOTE — Telephone Encounter (Signed)
 Copied from CRM [id number]. Topic: Appointments - Scheduling Inquiry for Clinic >> May 23, 2024  8:34 AM Avram MATSU wrote:  Reason for CRM: pt is wondering if she can do a telehealth/telephone call visit for her appt on Thursday 30th, please advise [phone]

## 2024-05-24 ENCOUNTER — Other Ambulatory Visit: Payer: Self-pay | Admitting: Internal Medicine

## 2024-05-24 DIAGNOSIS — E1169 Type 2 diabetes mellitus with other specified complication: Secondary | ICD-10-CM

## 2024-05-24 MED ORDER — BD PEN NEEDLE SHORT ULTRAFINE 31G X 8 MM MISC: 11 refills | Status: DC

## 2024-05-25 ENCOUNTER — Encounter: Payer: Self-pay | Admitting: Pharmacist

## 2024-05-25 ENCOUNTER — Ambulatory Visit: Attending: Internal Medicine | Admitting: Pharmacist

## 2024-05-25 ENCOUNTER — Other Ambulatory Visit: Payer: Self-pay | Admitting: Internal Medicine

## 2024-05-25 DIAGNOSIS — Z7985 Long-term (current) use of injectable non-insulin antidiabetic drugs: Secondary | ICD-10-CM

## 2024-05-25 DIAGNOSIS — E1169 Type 2 diabetes mellitus with other specified complication: Secondary | ICD-10-CM

## 2024-05-25 DIAGNOSIS — Z794 Long term (current) use of insulin: Secondary | ICD-10-CM

## 2024-05-25 MED ORDER — LANTUS SOLOSTAR 100 UNIT/ML ~~LOC~~ SOPN: 16.0000 [IU] | PEN_INJECTOR | Freq: Every day | SUBCUTANEOUS | 1 refills | Status: DC

## 2024-05-25 MED ORDER — SEMAGLUTIDE (2 MG/DOSE) 8 MG/3ML ~~LOC~~ SOPN: 2.0000 mg | PEN_INJECTOR | SUBCUTANEOUS | 3 refills | Status: DC

## 2024-05-25 NOTE — Progress Notes (Signed)
 I connected with  Jamie Lee on 05/25/24 by a video enabled telemedicine application and verified that I am speaking with the correct person using two identifiers.   I discussed the limitations of evaluation and management by telemedicine. The patient expressed understanding and agreed to proceed.  Location of the patient: home  Location of myself: at work   Persons participating in the call: the patient and myself   S:     No chief complaint on file.  43 y.o. female who presents for diabetes evaluation, education, and management. PMH is significant for T2DM, MDD, GAD.   Patient was referred and last seen by Primary Care Provider, Dr. Vicci, on 03/10/2024. A1c at that appt was 11.8 (up from 9.3 prior). At that visit, pt admitted to medication non-adherence.  I saw her on 04/21/2024 and had her continue her Ozempic  titration. Today, patient is in good spirits.  Patient reports Diabetes is longstanding. She is tolerating the Ozempic  okay. Denies any NV, abdominal pain. No changes in vision. She is up to the 1mg  weekly dose.   Family/Social History:  -Fhx: heart disease, DM, kidney disease -Tobacco: never smoker -Alcohol: none   Current diabetes medications include: Ozempic  1mg  weekly, Lantus  12 units daily Patient reports adherence to taking all medications as prescribed.   Insurance coverage: none  Patient denies hypoglycemic events.  Patient denies nocturia (nighttime urination).  Patient denies neuropathy (nerve pain). Patient denies visual changes. Patient reports self foot exams.   Patient reported dietary habits:  -Knows the right things to eat but admits to dietary indiscretion  Patient-reported exercise habits: none  O:  Date of Download: 05/25/2024,  % Time CGM is active: 98% Average Glucose: 259 mg/dL Glucose Management Indicator: 9.5%  Glucose Variability: 17.3 (goal <36%) Time in Goal:  - Time in range 70-180: 3% - Time above range: 97% - Time  below range: 0%  Lab Results  Component Value Date   HGBA1C 11.8 (A) 03/10/2024   There were no vitals filed for this visit.  Lipid Panel     Component Value Date/Time   CHOL 206 (H) 03/10/2024 1135   TRIG 87 03/10/2024 1135   HDL 94 03/10/2024 1135   CHOLHDL 2.2 03/10/2024 1135   LDLCALC 97 03/10/2024 1135    Clinical Atherosclerotic Cardiovascular Disease (ASCVD): No  The 10-year ASCVD risk score (Arnett DK, et al., 2019) is: 0.8%   Values used to calculate the score:     Age: 32 years     Clincally relevant sex: Female     Is Non-Hispanic African American: Yes     Diabetic: Yes     Tobacco smoker: No     Systolic Blood Pressure: 131 mmHg     Is BP treated: No     HDL Cholesterol: 94 mg/dL     Total Cholesterol: 206 mg/dL   A/P: Diabetes longstanding currently uncontrolled based on A1c in August. Her CGM AGP report shows improved glycemic control compared to this. Her GMI is down to 9.5%, however, most sugars remain > 180 mg/dL. She is tolerating Ozempic  well and I have sent the rxn for the 2 mg dose. She can pick this up and begin it after completing 2 more doses of the 1 mg weekly dose. We will also have her increase her Lantus  to 16 units daily. Patient is able to verbalize appropriate hypoglycemia management plan but is not symptomatic at this time. Medication adherence appears appropriate. - Continue Ozempic  1 mg weekly x2 more  doses. Then, increase to 2 mg weekly thereafter.  - INCREASE Lantus  to 16 units daily. -CGM use encouraged.  -Patient educated on purpose, proper use, and potential adverse effects of Ozempic , Lantus .  -Extensively discussed pathophysiology of diabetes, recommended lifestyle interventions, dietary effects on blood sugar control.  -Counseled on s/sx of and management of hypoglycemia.  -Next A1c anticipated 05/2024.  Written patient instructions provided. Patient verbalized understanding of treatment plan.  Total time in face to face counseling  30 minutes.    Follow-up:  PCP: 07/10/2024 Pharmacist in 1 month.  Herlene Fleeta Morris, PharmD, JAQUELINE, CPP Clinical Pharmacist [facility] [phone]

## 2024-07-10 ENCOUNTER — Ambulatory Visit: Payer: Self-pay | Attending: Internal Medicine | Admitting: Internal Medicine

## 2024-07-10 ENCOUNTER — Encounter: Payer: Self-pay | Admitting: Internal Medicine

## 2024-07-10 DIAGNOSIS — Z7985 Long-term (current) use of injectable non-insulin antidiabetic drugs: Secondary | ICD-10-CM

## 2024-07-10 DIAGNOSIS — Z794 Long term (current) use of insulin: Secondary | ICD-10-CM

## 2024-07-10 DIAGNOSIS — F330 Major depressive disorder, recurrent, mild: Secondary | ICD-10-CM

## 2024-07-10 DIAGNOSIS — E1169 Type 2 diabetes mellitus with other specified complication: Secondary | ICD-10-CM | POA: Diagnosis not present

## 2024-07-10 DIAGNOSIS — E785 Hyperlipidemia, unspecified: Secondary | ICD-10-CM

## 2024-07-10 DIAGNOSIS — Z23 Encounter for immunization: Secondary | ICD-10-CM

## 2024-07-10 LAB — POCT GLYCOSYLATED HEMOGLOBIN (HGB A1C): HbA1c, POC (controlled diabetic range): 9.2 % — AB (ref 0.0–7.0)

## 2024-07-10 LAB — GLUCOSE, POCT (MANUAL RESULT ENTRY): POC Glucose: 135 mg/dL — AB (ref 70–99)

## 2024-07-10 MED ORDER — LANTUS SOLOSTAR 100 UNIT/ML ~~LOC~~ SOPN: 18.0000 [IU] | PEN_INJECTOR | Freq: Every day | SUBCUTANEOUS | 1 refills | Status: DC

## 2024-07-10 MED ORDER — EMPAGLIFLOZIN 10 MG PO TABS: 10.0000 mg | ORAL_TABLET | Freq: Every day | ORAL | 3 refills | Status: AC

## 2024-07-10 NOTE — Progress Notes (Signed)
 Patient ID: Jamie Lee, female    DOB: 21-Mar-1981  MRN: [id number]  CC: Diabetes (DM f/u./No questions / concerns/Flu vax administered on 07/10/24 - C.A.)   Subjective: Jamie Lee is a 43 y.o. female who presents for chronic ds management. Her concerns today include:  Patient with history of DM type II, anxiety, MDD.    Discussed the use of AI scribe software for clinical note transcription with the patient, who gave verbal consent to proceed.  History of Present Illness Jamie Lee is a 43 year old female with diabetes who presents for a four-month follow-up.  DM: Results for orders placed or performed in visit on 07/10/24  POCT glucose (manual entry)   Collection Time: 07/10/24  4:28 PM  Result Value Ref Range   POC Glucose 135 (A) 70 - 99 mg/dl  POCT glycosylated hemoglobin (Hb A1C)   Collection Time: 07/10/24  4:39 PM  Result Value Ref Range   Hemoglobin A1C     HbA1c POC (<> result, manual entry)     HbA1c, POC (prediabetic range)     HbA1c, POC (controlled diabetic range) 9.2 (A) 0.0 - 7.0 %  Currently on Lantus  16 units daily and Ozempic  2 mg once a wk. Started on Ozempic  in August on last visit with me and has seen the clinical pharmacist a few times since then for titration. Now on 2 mg since the end of October. Initially, the medication did not help with her appetite, but over the last three to four weeks, she has experienced a decrease in appetite, stating 'I can't finish my food.' She eats smaller portions and returns to finish meals later. Her A1c has decreased from 11.8% in August to 9.2% today. She uses a continuous glucose monitor and notes her blood sugar does not go below 130 mg/dL. On review of her CGM, her TIR (70-180 mg/dL) is 54% over the past week and 50% over the past month. No hypoglycemic episodes are reported. Despite this, she feels her blood sugar should be in the target range more often. She exercises at home using a squat machine and  vibration plate, but has not been to the gym recently. She reports eating a cucumber salad for breakfast and avoiding heavy meals.  HL: LDL on last visit was 97. Started on Lipitor 10 mg; taking and tolerating med.  She discusses her mental health, noting she has not plugged in with a therapist since completing the intense out-pt treatment program. She has joined a supportive Facebook group for mothers. She has gone out to eat with a few of them and really feels supported. She is on Vraylar, Cymbalta, and trazodone, prescribed by [employer]. She feels her depression is better but experiences a 'blah' feeling, which she attributes to her medication.    Patient Active Problem List   Diagnosis Date Noted   Tetanus, diphtheria, and acellular pertussis (Tdap) vaccination declined 10/13/2022   Type 2 diabetes mellitus with morbid obesity (HCC) 01/22/2022   GAD (generalized anxiety disorder) 01/22/2022   Major depressive disorder, single episode, moderate (HCC) 01/22/2022     Medications Ordered Prior to Encounter[1]  Allergies[2]  Social History   Socioeconomic History   Marital status: Married    Spouse name: Not on file   Number of children: Not on file   Years of education: Not on file   Highest education level: Not on file  Occupational History   Not on file  Tobacco Use   Smoking status: Never  Smokeless tobacco: Never  Vaping Use   Vaping status: Never Used  Substance and Sexual Activity   Alcohol use: Yes    Comment: rare   Drug use: No   Sexual activity: Not Currently    Birth control/protection: Pill  Other Topics Concern   Not on file  Social History Narrative   Not on file   Social Drivers of Health   Tobacco Use: Low Risk (05/25/2024)   Patient History    Smoking Tobacco Use: Never    Smokeless Tobacco Use: Never    Passive Exposure: Not on file  Financial Resource Strain: Low Risk (03/10/2024)   Overall Financial Resource Strain (CARDIA)     Difficulty of Paying Living Expenses: Not very hard  Food Insecurity: No Food Insecurity (03/10/2024)   Epic    Worried About Radiation Protection Practitioner of Food in the Last Year: Never true    Ran Out of Food in the Last Year: Never true  Transportation Needs: No Transportation Needs (03/10/2024)   Epic    Lack of Transportation (Medical): No    Lack of Transportation (Non-Medical): No  Physical Activity: Insufficiently Active (03/10/2024)   Exercise Vital Sign    Days of Exercise per Week: 3 days    Minutes of Exercise per Session: 30 min  Stress: Stress Concern Present (03/10/2024)   [employer] of Occupational Health - Occupational Stress Questionnaire    Feeling of Stress: To some extent  Social Connections: Socially Integrated (03/10/2024)   Social Connection and Isolation Panel    Frequency of Communication with Friends and Family: Three times a week    Frequency of Social Gatherings with Friends and Family: Once a week    Attends Religious Services: More than 4 times per year    Active Member of Clubs or Organizations: Yes    Attends Banker Meetings: More than 4 times per year    Marital Status: Married  Catering Manager Violence: Not At Risk (03/10/2024)   Epic    Fear of Current or Ex-Partner: No    Emotionally Abused: No    Physically Abused: No    Sexually Abused: No  Depression (PHQ2-9): Low Risk (07/10/2024)   Depression (PHQ2-9)    PHQ-2 Score: 2  Alcohol Screen: Low Risk (03/10/2024)   Alcohol Screen    Last Alcohol Screening Score (AUDIT): 2  Housing: Low Risk (03/10/2024)   Epic    Unable to Pay for Housing in the Last Year: No    Number of Times Moved in the Last Year: 0    Homeless in the Last Year: No  Utilities: Not At Risk (03/10/2024)   Epic    Threatened with loss of utilities: No  Health Literacy: Adequate Health Literacy (03/10/2024)   B1300 Health Literacy    Frequency of need for help with medical instructions: Never    Family History   Problem Relation Age of Onset   Diabetes Maternal Grandfather    Blindness Maternal Grandfather    Heart disease Other    Hypertension Other    Cancer Other    Breast cancer Neg Hx     Past Surgical History:  Procedure Laterality Date   CHOLECYSTECTOMY     EYE SURGERY Bilateral     ROS: Review of Systems Negative except as stated above  PHYSICAL EXAM: BP 108/75 (BP Location: Left Arm, Patient Position: Sitting, Cuff Size: Large)   Pulse 95   Temp 97.8 F (36.6 C) (Oral)   Ht 5' 7 (  1.702 m)   Wt 270 lb (122.5 kg)   SpO2 93%   BMI 42.29 kg/m   Wt Readings from Last 3 Encounters:  07/10/24 270 lb (122.5 kg)  03/10/24 268 lb (121.6 kg)  10/13/22 267 lb (121.1 kg)    Physical Exam  General appearance - alert, well appearing, middle age obese AAF and in no distress Mental status - normal mood, behavior, speech, dress, motor activity, and thought processes Neck - supple, no significant adenopathy Chest - clear to auscultation, no wheezes, rales or rhonchi, symmetric air entry Heart - normal rate, regular rhythm, normal S1, S2, no murmurs, rubs, clicks or gallops Extremities - peripheral pulses normal, no pedal edema, no clubbing or cyanosis     07/10/2024    5:33 PM 03/10/2024   10:08 AM 02/04/2024    9:37 AM  Depression screen PHQ 2/9  Decreased Interest 0 0   Down, Depressed, Hopeless 0 1   PHQ - 2 Score 0 1   Altered sleeping 1 3   Tired, decreased energy 0 1   Change in appetite 0 1   Feeling bad or failure about yourself  0 0   Trouble concentrating 1 2   Moving slowly or fidgety/restless 0 1   Suicidal thoughts 0 0   PHQ-9 Score 2 9    Difficult doing work/chores Not difficult at all Somewhat difficult      Information is confidential and restricted. Go to Review Flowsheets to unlock data.   Data saved with a previous flowsheet row definition        Latest Ref Rng & Units 03/10/2024   11:35 AM 02/03/2023    1:56 PM 10/13/2022   11:56 AM  CMP   Glucose 70 - 99 mg/dL 715  820  709   BUN 6 - 24 mg/dL 7  11  11    Creatinine 0.57 - 1.00 mg/dL 9.28  9.27  9.26   Sodium 134 - 144 mmol/L 136  137  137   Potassium 3.5 - 5.2 mmol/L 4.9  4.5  4.2   Chloride 96 - 106 mmol/L 96  102  99   CO2 20 - 29 mmol/L 23  20  21    Calcium  8.7 - 10.2 mg/dL 89.5  89.9  89.8   Total Protein 6.0 - 8.5 g/dL 7.4  6.7    Total Bilirubin 0.0 - 1.2 mg/dL 0.7  0.4    Alkaline Phos 44 - 121 IU/L 98  67    AST 0 - 40 IU/L 16  16    ALT 0 - 32 IU/L 19  14     Lipid Panel     Component Value Date/Time   CHOL 206 (H) 03/10/2024 1135   TRIG 87 03/10/2024 1135   HDL 94 03/10/2024 1135   CHOLHDL 2.2 03/10/2024 1135   LDLCALC 97 03/10/2024 1135    CBC    Component Value Date/Time   WBC 4.9 03/10/2024 1135   WBC 5.5 03/15/2021 1900   RBC 4.72 03/10/2024 1135   RBC 4.19 03/15/2021 1900   HGB 14.7 03/10/2024 1135   HCT 45.7 03/10/2024 1135   PLT 178 03/10/2024 1135   MCV 97 03/10/2024 1135   MCH 31.1 03/10/2024 1135   MCH 31.3 03/15/2021 1900   MCHC 32.2 03/10/2024 1135   MCHC 33.2 03/15/2021 1900   RDW 11.8 03/10/2024 1135   LYMPHSABS 1.7 05/22/2010 1205   MONOABS 0.4 05/22/2010 1205   EOSABS 0.2 05/22/2010 1205  BASOSABS 0.0 05/22/2010 1205    ASSESSMENT AND PLAN: 1. Type 2 diabetes mellitus with morbid obesity (HCC) (Primary) A1c improved to 9.2% but remains above target. Blood glucose control suboptimal with 45% time in range. Weight stable despite max dose of Ozempic  and decrease appetite. Discussed Jardiance  for additional glucose control and weight loss, with potential side effects explained including frequent urination, chances of recurrent UTI and vaginal yeast infection.  Discussed the importance of staying hydrated.  Stop the medicine and let me know if she develops UTI or recurrent vaginal yeast infections. - Increased Lantus  to 18 units nightly. - Prescribed Jardiance  10 mg daily. - Encouraged 30 minutes of exercise three times  weekly. - POCT glucose (manual entry) - POCT glycosylated hemoglobin (Hb A1C) - empagliflozin  (JARDIANCE ) 10 MG TABS tablet; Take 1 tablet (10 mg total) by mouth daily.  Dispense: 90 tablet; Refill: 3 - Ambulatory referral to Ophthalmology - insulin  glargine (LANTUS  SOLOSTAR) 100 UNIT/ML Solostar Pen; Inject 18 Units into the skin daily.  Dispense: 15 mL; Refill: 1  2. Long term (current) use of insulin  (HCC) 3. Long-term (current) use of injectable non-insulin  antidiabetic drugs See #1  4. Hyperlipidemia associated with type 2 diabetes mellitus (HCC) Continue atorvastatin  10 mg daily.  We will check lipid profile on subsequent visit.  5. Need for immunization against influenza - Flu vaccine trivalent PF, 6mos and older(Flulaval,Afluria,Fluarix,Fluzone)  6. Mild MMD recurrent Symptoms improved with current medications; followed by a [facility] provider for medication management. Engaged in online support group which she finds helpful.   This documentation was completed using Paediatric nurse.  Any transcriptional errors are unintentional.  Orders Placed This Encounter  Procedures   Flu vaccine trivalent PF, 6mos and older(Flulaval,Afluria,Fluarix,Fluzone)   Ambulatory referral to Ophthalmology   POCT glucose (manual entry)   POCT glycosylated hemoglobin (Hb A1C)     Requested Prescriptions   Signed Prescriptions Disp Refills   empagliflozin  (JARDIANCE ) 10 MG TABS tablet 90 tablet 3    Sig: Take 1 tablet (10 mg total) by mouth daily.   insulin  glargine (LANTUS  SOLOSTAR) 100 UNIT/ML Solostar Pen 15 mL 1    Sig: Inject 18 Units into the skin daily.    Return in about 4 months (around 11/08/2024).  Barnie Louder, MD, FACP     [1]  Current Outpatient Medications on File Prior to Visit  Medication Sig Dispense Refill   Accu-Chek Softclix Lancets lancets Use as instructed 100 each 12   atorvastatin  (LIPITOR) 10 MG tablet Take 1 tablet (10 mg total) by mouth  daily. 90 tablet 3   Blood Glucose Monitoring Suppl (ACCU-CHEK GUIDE) w/Device KIT UAD 1 kit 0   cariprazine (VRAYLAR) 3 MG capsule Take 3 mg by mouth daily.     Continuous Blood Gluc Receiver (FREESTYLE LIBRE 3 READER) DEVI 1 Device by Does not apply route daily. ICD E11.69, E66.01 1 each 0   Continuous Glucose Sensor (FREESTYLE LIBRE 3 PLUS SENSOR) MISC USE TO CHECK BLOOD SUGAR CONTINUOUSLY THROUGHOUT THE DAY. CHANGE SENSOR EVERY 15 DAYS. 2 each 6   DULoxetine (CYMBALTA) 30 MG capsule Take 30 mg by mouth daily.     fluconazole  (DIFLUCAN ) 150 MG tablet Take 1 tablet today, another on day 3 of antibiotics and another on last day of antibiotics. 3 tablet 0   glucose blood (ACCU-CHEK GUIDE) test strip Use as instructed 100 each 12   Insulin  Pen Needle (BD PEN NEEDLE SHORT ULTRAFINE) 31G X 8 MM MISC Use once daily to  administer insulin  and once a week to administer Ozempic . 100 each 11   metroNIDAZOLE  (FLAGYL ) 500 MG tablet Take 1 tablet (500 mg total) by mouth 2 (two) times daily. 14 tablet 0   Semaglutide , 2 MG/DOSE, 8 MG/3ML SOPN Inject 2 mg as directed once a week. 3 mL 3   traZODone (DESYREL) 50 MG tablet Take 50 mg by mouth at bedtime as needed.     tretinoin (RETIN-A) 0.05 % cream Apply topically 3 (three) times a week.     No current facility-administered medications on file prior to visit.  [2]  Allergies Allergen Reactions   Metformin And Related Diarrhea

## 2024-07-10 NOTE — Patient Instructions (Addendum)
°  VISIT SUMMARY: Today, we reviewed your diabetes management and overall health. Your A1c has improved, but we need to continue working on better blood sugar control. We also discussed your mental health and general health maintenance.  YOUR PLAN: -TYPE 2 DIABETES MELLITUS WITH MORBID OBESITY: Type 2 diabetes is a condition where your body does not use insulin  properly, leading to high blood sugar levels. We have increased your Lantus  insulin  to 18 units nightly and added Jardiance  10 mg daily to help control your blood sugar and assist with weight loss. Please continue exercising for 30 minutes three times a week, stay hydrated, and monitor for any signs of dehydration or infection while on Jardiance .  -HYPERLIPIDEMIA ASSOCIATED WITH TYPE 2 DIABETES MELLITUS: Hyperlipidemia means you have high levels of fats in your blood, which is common with diabetes. Your condition is currently managed with medication, and no changes are needed at this time.  -MAJOR DEPRESSIVE DISORDER, RECURRENT: Major depressive disorder is a mental health condition characterized by persistent feelings of sadness and loss of interest. Your symptoms have improved with your current medications. Please continue taking them as prescribed and consider following up with a mental health provider for additional support.  -GENERAL HEALTH MAINTENANCE: Regular health check-ups are important for managing diabetes. We have submitted a referral for a diabetic eye exam to ensure your eyes are healthy.  INSTRUCTIONS: Please follow up with a diabetic eye exam as referred. Continue monitoring your blood sugar levels and maintain your exercise routine. If you experience any side effects from the new medication or have concerns about your mental health, contact us  or your mental health provider.                      Contains text generated by Abridge.                                 Contains text  generated by Abridge.

## 2024-07-12 ENCOUNTER — Telehealth: Payer: Self-pay | Admitting: Pharmacist

## 2024-07-12 NOTE — Telephone Encounter (Signed)
 Jamie Lee I called this patient to confirm her appointment with you tomorrow at 3:00 PM, she stated she thought it was TELE VISIT and didn't take off for work and is wondering if the appointment can be changed. Please advise if okay to do so and I can call her tomorrow thank you!!

## 2024-07-13 ENCOUNTER — Telehealth: Payer: Self-pay | Admitting: Pharmacist

## 2024-07-13 ENCOUNTER — Ambulatory Visit: Admitting: Pharmacist

## 2024-07-13 DIAGNOSIS — Z7985 Long-term (current) use of injectable non-insulin antidiabetic drugs: Secondary | ICD-10-CM

## 2024-07-13 DIAGNOSIS — E1169 Type 2 diabetes mellitus with other specified complication: Secondary | ICD-10-CM

## 2024-07-13 DIAGNOSIS — Z7984 Long term (current) use of oral hypoglycemic drugs: Secondary | ICD-10-CM

## 2024-07-13 DIAGNOSIS — Z794 Long term (current) use of insulin: Secondary | ICD-10-CM

## 2024-07-13 MED ORDER — TIRZEPATIDE 10 MG/0.5ML ~~LOC~~ SOAJ: 10.0000 mg | SUBCUTANEOUS | 0 refills | Status: AC

## 2024-07-13 MED ORDER — TIRZEPATIDE 7.5 MG/0.5ML ~~LOC~~ SOAJ: 7.5000 mg | SUBCUTANEOUS | 0 refills | Status: DC

## 2024-07-13 NOTE — Progress Notes (Signed)
 I connected with  Jamie Lee on 07/13/2024 by a video enabled telemedicine application and verified that I am speaking with the correct person using two identifiers.   I discussed the limitations of evaluation and management by telemedicine. The patient expressed understanding and agreed to proceed.  Location of the patient: home  Location of myself: at work   Persons participating in the call: the patient and myself   S:     No chief complaint on file.  43 y.o. female who presents for diabetes evaluation, education, and management. PMH is significant for T2DM, MDD, GAD.   Patient was referred and last seen by Primary Care Provider, Dr. Vicci, on 07/10/2024.    Dr. Vicci saw her on 03/10/24. A1c was 11.8%. Dr. Vicci started her on Ozempic  at that visit. Of note, pt's weight was 268 lbs with a BMI of 41.97. I saw her on 04/21/24 and 05/25/24 to help adjust Ozempic  to the 2 mg weekly dose. When she saw Dr. Vicci on 07/10/24, her A1c had improved but was still above goal at 9.2%. Her weight was unchanged at 268 lbs and a BMI of 41.97. This was despite using Ozempic  2 mg weekly.   Today, patient admits that she is disappointed that she did not have any weight loss with the Ozempic . She is tolerating the Ozempic  okay. Denies any NV, abdominal pain. No changes in vision. I note that she has also tried Trulicty in the past as well.    Family/Social History:  -Fhx: heart disease, DM, kidney disease -Tobacco: never smoker -Alcohol: none   Current diabetes medications include: Ozempic  2mg  weekly, Lantus  18 units daily, Jardiance  10 mg daily Patient reports adherence to taking all medications as prescribed.   Insurance coverage: none  Patient denies hypoglycemic events.  Patient denies nocturia (nighttime urination).  Patient denies neuropathy (nerve pain). Patient denies visual changes. Patient reports self foot exams.   Patient reported dietary habits:  -Knows the  right things to eat but admits to dietary indiscretion  Patient-reported exercise habits: none  O:  Date of Download: 07/13/24,  % Time CGM is active: 99% Average Glucose: 184 mg/dL Glucose Management Indicator: 7.7%  Glucose Variability: 14.4 (goal <36%) Time in Goal:  - Time in range 70-180: 48% - Time above range: 52% - Time below range: 0%  Lab Results  Component Value Date   HGBA1C 9.2 (A) 07/10/2024   There were no vitals filed for this visit.  Lipid Panel     Component Value Date/Time   CHOL 206 (H) 03/10/2024 1135   TRIG 87 03/10/2024 1135   HDL 94 03/10/2024 1135   CHOLHDL 2.2 03/10/2024 1135   LDLCALC 97 03/10/2024 1135    Clinical Atherosclerotic Cardiovascular Disease (ASCVD): No  The 10-year ASCVD risk score (Arnett DK, et al., 2019) is: 0.4%   Values used to calculate the score:     Age: 61 years     Clinically relevant sex: Female     Is Non-Hispanic African American: Yes     Diabetic: Yes     Tobacco smoker: No     Systolic Blood Pressure: 108 mmHg     Is BP treated: No     HDL Cholesterol: 94 mg/dL     Total Cholesterol: 206 mg/dL   A/P: Diabetes longstanding currently uncontrolled based on A1c of 9.2%. However, her AGP report shows that her glycemic control is better. Her GMI is down to 7.7% over the last 28 days and  TIR is 48%. She is tolerating Ozempic  well at the 2 mg dose. However, she has not experienced weight loss or goal glycemic control. I think we have a good chance of Mounjaro  approval at this point. I recommend to stop Ozempic  and change to Mounjaro  7.5 mg weekly. Will pursue a PA.  - CONTINUE Ozempic  2 mg weekly for now.  - Will pursue PA approval for Mounjaro  and change to 7.5 mg weekly if/when approved. Once we do that, she can stop the Ozempic . - CONTINUE Lantus  18 units daily. - CONTINUE Jardiance  10 mg daily.  - CGM use encouraged.  -Patient educated on purpose, proper use, and potential adverse effects of Mounjaro , Lantus , and  Jardiance .  -Extensively discussed pathophysiology of diabetes, recommended lifestyle interventions, dietary effects on blood sugar control.  -Counseled on s/sx of and management of hypoglycemia.  -Next A1c anticipated 09/2024.  Written patient instructions provided. Patient verbalized understanding of treatment plan.  Total time in face to face counseling 30 minutes.    Follow-up:  PCP: 11/11/23 Pharmacist in Feb via tele.  Jamie Lee, PharmD, Jamie Lee, CPP Clinical Pharmacist [facility] [phone]

## 2024-07-13 NOTE — Telephone Encounter (Signed)
 See my office visit note for complete details. Patient with trial and failure of Ozempic  and Trulicity . No weight loss achieved and suboptimal control of A1c. Can we attempt a PA for Mounjaro ?

## 2024-07-13 NOTE — Telephone Encounter (Signed)
 Yes ma'am, can change to telephone appointment.

## 2024-07-14 ENCOUNTER — Telehealth: Payer: Self-pay

## 2024-07-14 ENCOUNTER — Other Ambulatory Visit: Payer: Self-pay

## 2024-07-14 NOTE — Telephone Encounter (Signed)
 Pharmacy Patient Advocate Encounter  Received notification from [facility] that Prior Authorization for MOUNJARO  has been APPROVED from 07/14/2024 to 07/14/2025   PA #/Case ID/Reference #: [id number]

## 2024-07-17 ENCOUNTER — Other Ambulatory Visit: Payer: Self-pay

## 2024-08-10 ENCOUNTER — Other Ambulatory Visit: Payer: Self-pay | Admitting: Internal Medicine

## 2024-08-31 ENCOUNTER — Encounter: Payer: Self-pay | Admitting: Pharmacist

## 2024-08-31 ENCOUNTER — Ambulatory Visit: Payer: Self-pay | Admitting: Pharmacist

## 2024-08-31 DIAGNOSIS — Z794 Long term (current) use of insulin: Secondary | ICD-10-CM

## 2024-08-31 DIAGNOSIS — E1169 Type 2 diabetes mellitus with other specified complication: Secondary | ICD-10-CM

## 2024-08-31 DIAGNOSIS — Z7984 Long term (current) use of oral hypoglycemic drugs: Secondary | ICD-10-CM

## 2024-08-31 DIAGNOSIS — Z7985 Long-term (current) use of injectable non-insulin antidiabetic drugs: Secondary | ICD-10-CM

## 2024-08-31 NOTE — Progress Notes (Signed)
 "   I connected with  Jamie Lee on 08/31/24 by a video enabled telemedicine application and verified that I am speaking with the correct person using two identifiers.   I discussed the limitations of evaluation and management by telemedicine. The patient expressed understanding and agreed to proceed.  Location of the patient: home  Location of myself: at work   Persons participating in the call: the patient and myself   S:     No chief complaint on file.  44 y.o. female who presents for diabetes evaluation, education, and management. PMH is significant for T2DM, MDD, GAD.   Patient was referred and last seen by Primary Care Provider, Dr. Vicci, on 07/10/2024.    I saw her on 07/13/24 and changed her Ozempic  to Mounjaro .   Today, patient is doing well. AGP from last 14 days is listed below, however, she notes that she has not had to use insulin  in the last ~10 days. She is taking the Mounjaro  7.5 mg and has completed 2 injections so far. She is taking the Jardiance  once daily. She has increased her physical activity and improved her diet. In fact, her weight at the gym yesterday was 250 lbs. Denies any NV, abdominal pain. No changes in vision.    Family/Social History:  -Fhx: heart disease, DM, kidney disease -Tobacco: never smoker -Alcohol: none   Current diabetes medications include:  Lantus  18 units daily (has not taken in 10 days), Mounjaro  7.5 mg weekly, Jardiance  10 mg daily Patient reports adherence to taking all medications as prescribed.   Insurance coverage: none  Patient denies hypoglycemic events.  Patient denies nocturia (nighttime urination).  Patient denies neuropathy (nerve pain). Patient denies visual changes. Patient reports self foot exams.   Patient-reported exercise habits:  -30 minutes of Cardio daily for the past ~1.5 months  O:  Date of Download: 08/31/2024, 14 days  % Time CGM is active: 59% Average Glucose: 118 mg/dL Glucose Management  Indicator: 6.1%  Glucose Variability: 16.8 (goal <36%) Time in Goal:  - Time in range 70-180: 99% - Time above range: 1% - Time below range: 0%  Lab Results  Component Value Date   HGBA1C 9.2 (A) 07/10/2024   There were no vitals filed for this visit.  Lipid Panel     Component Value Date/Time   CHOL 206 (H) 03/10/2024 1135   TRIG 87 03/10/2024 1135   HDL 94 03/10/2024 1135   CHOLHDL 2.2 03/10/2024 1135   LDLCALC 97 03/10/2024 1135    Clinical Atherosclerotic Cardiovascular Disease (ASCVD): No  The 10-year ASCVD risk score (Arnett DK, et al., 2019) is: 0.4%   Values used to calculate the score:     Age: 45 years     Clinically relevant sex: Female     Is Non-Hispanic African American: Yes     Diabetic: Yes     Tobacco smoker: No     Systolic Blood Pressure: 108 mmHg     Is BP treated: No     HDL Cholesterol: 94 mg/dL     Total Cholesterol: 206 mg/dL   A/P: Diabetes longstanding currently uncontrolled based on A1c of 9.2%. However, her AGP report shows that her glycemic control is exemplary. Commended for this, her med adherence and her lifestyle changes! Her GMI is down to 6.1% over the last 14 days and TIR is 99%. She is tolerating Mounjaro  well at the 7.5 mg dose. We will continue Mounjaro  titration. - CONTINUE Mounjaro   7.5 mg weekly for  2 more weeks. Then, increase to 10 mg once weekly thereafter.  - DISCONTINUE Lantus  18 units daily. - CONTINUE Jardiance  10 mg daily.  -Patient educated on purpose, proper use, and potential adverse effects of Mounjaro , Lantus , and Jardiance .  -Extensively discussed pathophysiology of diabetes, recommended lifestyle interventions, dietary effects on blood sugar control.  -Counseled on s/sx of and management of hypoglycemia.  -Next A1c anticipated 09/2024.  Written patient instructions provided. Patient verbalized understanding of treatment plan.  Total time in face to face counseling 30 minutes.    Follow-up:  PCP:  11/11/23 Pharmacist 10/05/24 for A1c.  Jamie Lee, PharmD, Jamie Lee, CPP Clinical Pharmacist [facility] [phone]    "

## 2024-10-05 ENCOUNTER — Ambulatory Visit: Payer: Self-pay | Admitting: Pharmacist

## 2024-11-10 ENCOUNTER — Ambulatory Visit: Payer: Self-pay | Admitting: Internal Medicine
